# Patient Record
Sex: Male | Born: 1950
Health system: Southern US, Community
[De-identification: ages and names within clinical notes are randomized; demographics above are authoritative.]

## PROBLEM LIST (undated history)

## (undated) DIAGNOSIS — N179 Acute kidney failure, unspecified: Secondary | ICD-10-CM

## (undated) DIAGNOSIS — R55 Syncope and collapse: Secondary | ICD-10-CM

## (undated) DIAGNOSIS — I480 Paroxysmal atrial fibrillation: Secondary | ICD-10-CM

## (undated) DIAGNOSIS — N182 Chronic kidney disease, stage 2 (mild): Secondary | ICD-10-CM

## (undated) DIAGNOSIS — J45909 Unspecified asthma, uncomplicated: Secondary | ICD-10-CM

## (undated) DIAGNOSIS — F419 Anxiety disorder, unspecified: Secondary | ICD-10-CM

## (undated) DIAGNOSIS — G4733 Obstructive sleep apnea (adult) (pediatric): Secondary | ICD-10-CM

## (undated) DIAGNOSIS — E162 Hypoglycemia, unspecified: Secondary | ICD-10-CM

## (undated) DIAGNOSIS — Z9989 Dependence on other enabling machines and devices: Secondary | ICD-10-CM

## (undated) DIAGNOSIS — N183 Chronic kidney disease, stage 3 unspecified: Secondary | ICD-10-CM

## (undated) DIAGNOSIS — H9190 Unspecified hearing loss, unspecified ear: Secondary | ICD-10-CM

## (undated) DIAGNOSIS — Z599 Problem related to housing and economic circumstances, unspecified: Secondary | ICD-10-CM

## (undated) DIAGNOSIS — Z598 Other problems related to housing and economic circumstances: Secondary | ICD-10-CM

## (undated) DIAGNOSIS — I4892 Unspecified atrial flutter: Secondary | ICD-10-CM

## (undated) DIAGNOSIS — E785 Hyperlipidemia, unspecified: Secondary | ICD-10-CM

## (undated) DIAGNOSIS — F329 Major depressive disorder, single episode, unspecified: Secondary | ICD-10-CM

## (undated) DIAGNOSIS — I10 Essential (primary) hypertension: Secondary | ICD-10-CM

## (undated) DIAGNOSIS — E669 Obesity, unspecified: Secondary | ICD-10-CM

## (undated) DIAGNOSIS — E1169 Type 2 diabetes mellitus with other specified complication: Secondary | ICD-10-CM

## (undated) HISTORY — DX: Chronic kidney disease, stage 3 (moderate): N18.3

## (undated) HISTORY — DX: Syncope and collapse: R55

## (undated) HISTORY — DX: Chronic kidney disease, stage 3 unspecified: N18.30

## (undated) HISTORY — DX: Chronic kidney disease, stage 2 (mild): N18.2

## (undated) HISTORY — DX: Problem related to housing and economic circumstances, unspecified: Z59.9

## (undated) HISTORY — DX: Obesity, unspecified: E66.9

## (undated) HISTORY — DX: Paroxysmal atrial fibrillation: I48.0

## (undated) HISTORY — DX: Acute kidney failure, unspecified: N17.9

## (undated) HISTORY — PX: OTHER SURGICAL HISTORY: SHX169

## (undated) HISTORY — DX: Other problems related to housing and economic circumstances: Z59.8

## (undated) HISTORY — DX: Major depressive disorder, single episode, unspecified: F32.9

## (undated) HISTORY — DX: Unspecified asthma, uncomplicated: J45.909

## (undated) HISTORY — DX: Hypoglycemia, unspecified: E16.2

## (undated) HISTORY — DX: Unspecified atrial flutter: I48.92

---

## 2004-03-17 ENCOUNTER — Ambulatory Visit: Payer: Self-pay | Admitting: Internal Medicine

## 2004-06-05 ENCOUNTER — Ambulatory Visit: Payer: Self-pay | Admitting: Internal Medicine

## 2004-09-28 ENCOUNTER — Ambulatory Visit: Payer: Self-pay | Admitting: Internal Medicine

## 2005-01-03 ENCOUNTER — Ambulatory Visit: Payer: Self-pay | Admitting: Internal Medicine

## 2005-02-02 ENCOUNTER — Ambulatory Visit: Payer: Self-pay | Admitting: Internal Medicine

## 2005-03-08 ENCOUNTER — Ambulatory Visit: Payer: Self-pay | Admitting: Internal Medicine

## 2005-03-22 ENCOUNTER — Ambulatory Visit: Payer: Self-pay | Admitting: Internal Medicine

## 2006-04-24 ENCOUNTER — Ambulatory Visit: Payer: Self-pay | Admitting: Internal Medicine

## 2006-07-03 ENCOUNTER — Ambulatory Visit: Payer: Self-pay | Admitting: Internal Medicine

## 2006-08-01 ENCOUNTER — Ambulatory Visit: Payer: Self-pay | Admitting: Internal Medicine

## 2006-12-10 ENCOUNTER — Telehealth: Payer: Self-pay | Admitting: Internal Medicine

## 2006-12-10 DIAGNOSIS — F329 Major depressive disorder, single episode, unspecified: Secondary | ICD-10-CM

## 2006-12-10 DIAGNOSIS — F3289 Other specified depressive episodes: Secondary | ICD-10-CM

## 2006-12-10 HISTORY — DX: Major depressive disorder, single episode, unspecified: F32.9

## 2006-12-10 HISTORY — DX: Other specified depressive episodes: F32.89

## 2006-12-24 ENCOUNTER — Telehealth: Payer: Self-pay | Admitting: Internal Medicine

## 2006-12-25 ENCOUNTER — Telehealth (INDEPENDENT_AMBULATORY_CARE_PROVIDER_SITE_OTHER): Payer: Self-pay | Admitting: *Deleted

## 2007-01-30 ENCOUNTER — Telehealth (INDEPENDENT_AMBULATORY_CARE_PROVIDER_SITE_OTHER): Payer: Self-pay | Admitting: *Deleted

## 2007-11-12 ENCOUNTER — Telehealth (INDEPENDENT_AMBULATORY_CARE_PROVIDER_SITE_OTHER): Payer: Self-pay | Admitting: *Deleted

## 2007-11-17 ENCOUNTER — Encounter: Admission: RE | Admit: 2007-11-17 | Discharge: 2007-11-17 | Payer: Self-pay | Admitting: Family Medicine

## 2009-06-14 IMAGING — CR DG CHEST 2V
2 series · 2 of 2 positions shown · non-contrast
Comparison: None available.

CLINICAL DATA: Cough, shortness of breath and chest pain.

CHEST - 2 VIEW

[view not recorded (1 of 2)]
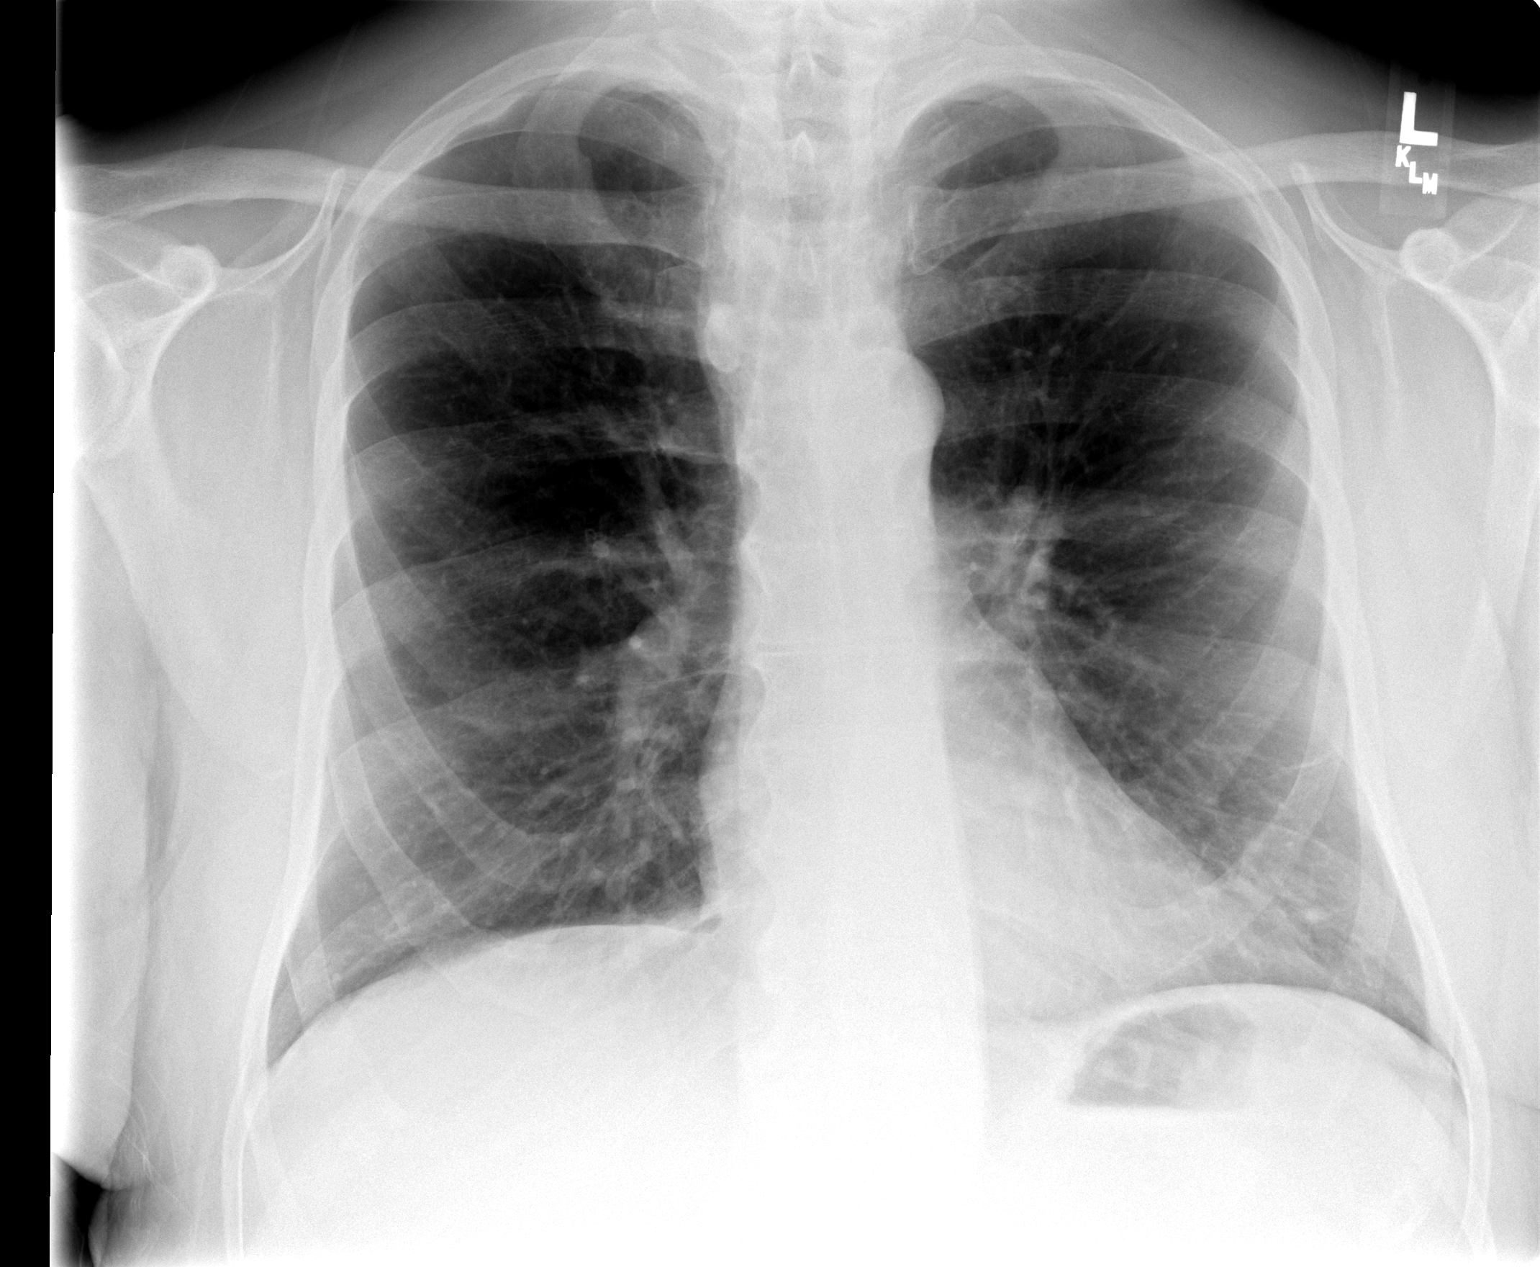

[view not recorded (2 of 2)]
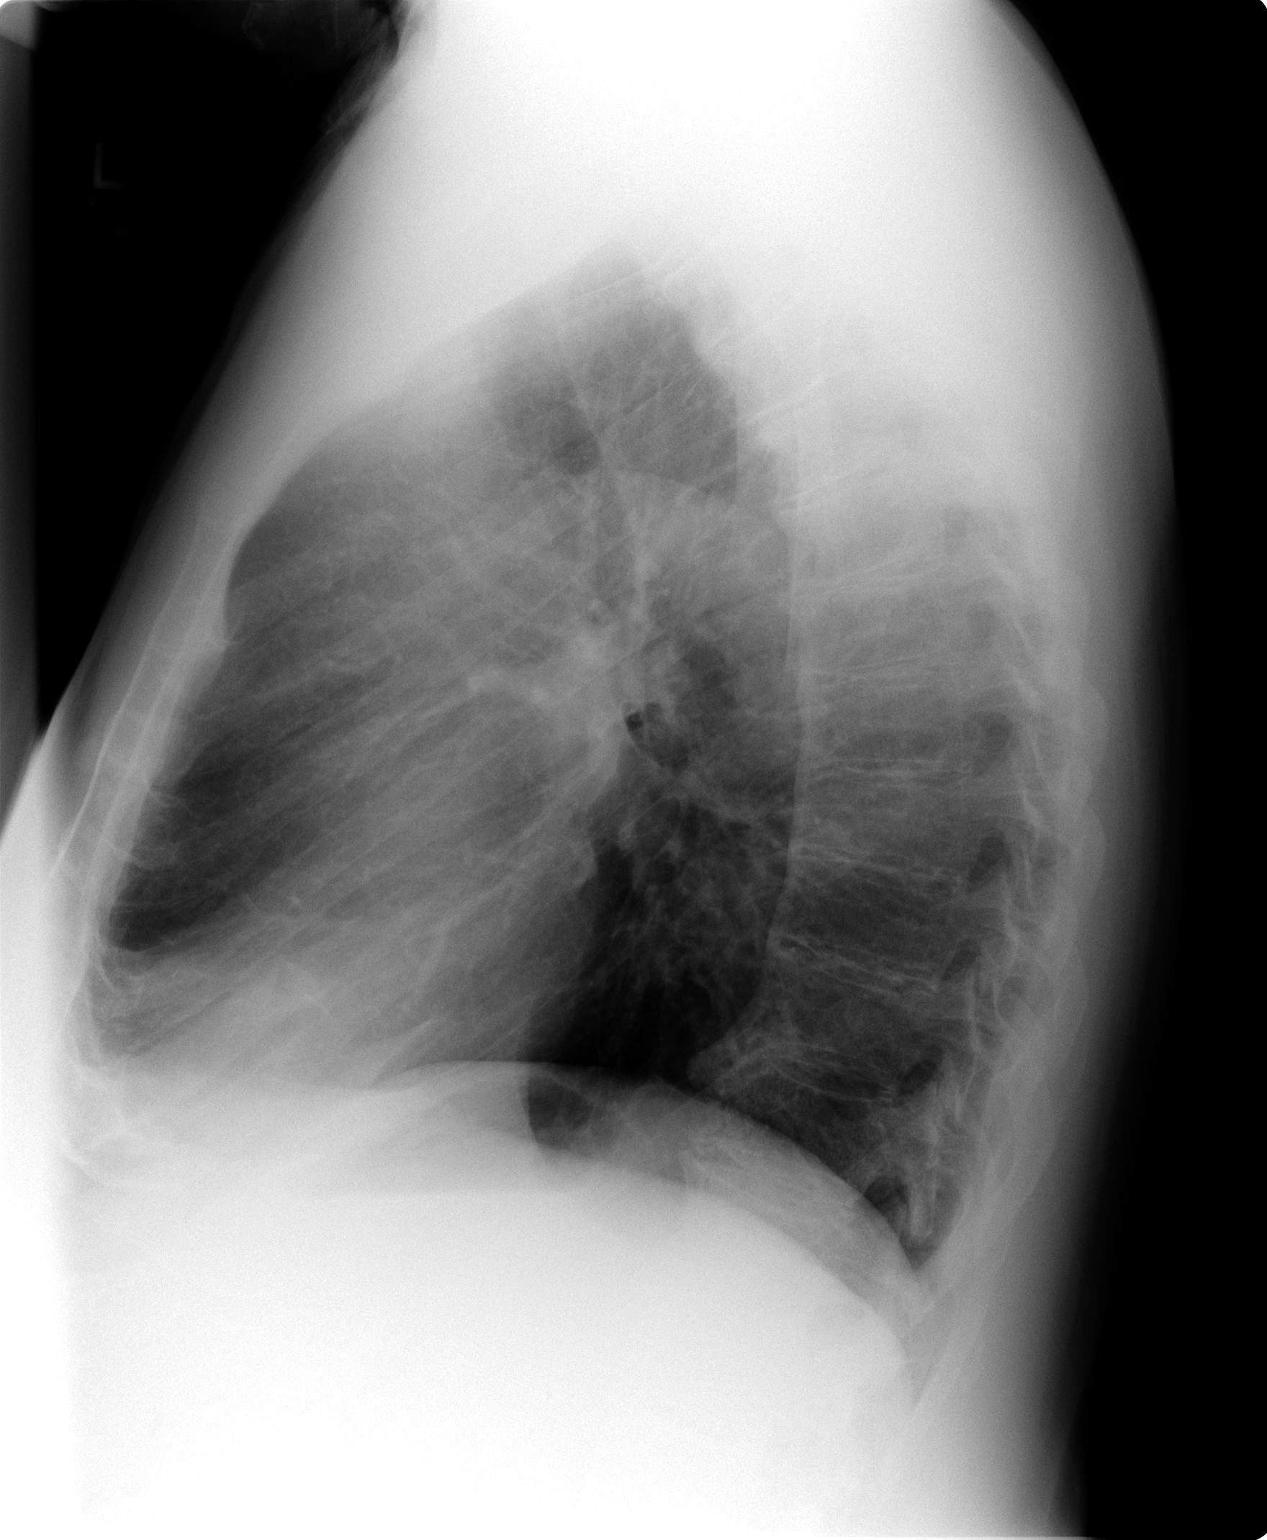

[2 of 2 positions shown; findings below may reference images not displayed]

FINDINGS: Trachea is midline.  Heart size normal.  There may be a
calcified lymph node in the right paratracheal station.  Linear
scar or subsegmental atelectasis in the lingula.  Lungs otherwise
clear.  No pleural fluid.  There is flowing anterior osteophytosis
in the thoracic spine.
IMPRESSION: No acute findings.

## 2011-10-09 ENCOUNTER — Encounter (HOSPITAL_COMMUNITY): Payer: Self-pay | Admitting: Emergency Medicine

## 2011-10-09 ENCOUNTER — Emergency Department (HOSPITAL_COMMUNITY)
Admission: EM | Admit: 2011-10-09 | Discharge: 2011-10-09 | Disposition: A | Discharge status: Home / Self Care | Payer: Self-pay | Attending: Emergency Medicine | Admitting: Emergency Medicine

## 2011-10-09 DIAGNOSIS — E119 Type 2 diabetes mellitus without complications: Secondary | ICD-10-CM | POA: Insufficient documentation

## 2011-10-09 DIAGNOSIS — S61219A Laceration without foreign body of unspecified finger without damage to nail, initial encounter: Secondary | ICD-10-CM

## 2011-10-09 DIAGNOSIS — W260XXA Contact with knife, initial encounter: Secondary | ICD-10-CM | POA: Insufficient documentation

## 2011-10-09 DIAGNOSIS — S61209A Unspecified open wound of unspecified finger without damage to nail, initial encounter: Secondary | ICD-10-CM | POA: Insufficient documentation

## 2011-10-09 DIAGNOSIS — Z79899 Other long term (current) drug therapy: Secondary | ICD-10-CM | POA: Insufficient documentation

## 2011-10-09 DIAGNOSIS — Y93G1 Activity, food preparation and clean up: Secondary | ICD-10-CM | POA: Insufficient documentation

## 2011-10-09 DIAGNOSIS — S6990XA Unspecified injury of unspecified wrist, hand and finger(s), initial encounter: Secondary | ICD-10-CM

## 2011-10-09 DIAGNOSIS — W261XXA Contact with sword or dagger, initial encounter: Secondary | ICD-10-CM | POA: Insufficient documentation

## 2011-10-09 MED ORDER — CEPHALEXIN 500 MG PO CAPS: 500.0000 mg | ORAL_CAPSULE | Freq: Four times a day (QID) | ORAL | Status: AC

## 2011-10-09 MED ORDER — BACITRACIN ZINC 500 UNIT/GM EX OINT
TOPICAL_OINTMENT | CUTANEOUS | Status: AC
  Administered 2011-10-09: 1
  Filled 2011-10-09: qty 0.9

## 2011-10-09 NOTE — ED Notes (Signed)
Rt thumb laceration from a kitchen knife approx 45 mins ago, has had tetnus in the last 5 yrs, bleding controlled with bandage. Able to move and has good circulation, wrapped with 4x4 and kling and pt refused to get a cbg due to cost.

## 2011-10-09 NOTE — Discharge Instructions (Signed)
Fingernail Removal Fingernails may need to be removed because of injury, infections, or correction of abnormal growth. A special non-stick bandage has been put on your finger tightly to prevent bleeding. Fingernails will usually grow back if the finger has not been badly injured and you carefully follow instructions. HOME CARE INSTRUCTIONS   Keep your hand elevated above your heart to relieve pain and swelling.   Keep your dressing dry and clean.   Change your bandage in 24 hours.   After your bandage is changed, soak your hand in warm soapy water for 10 to 20 minutes. Do this 3 times per day. This helps reduce pain and swelling. After soaking your hand, apply a clean, dry bandage. Change your bandage if it is wet or dirty.   Only take over-the-counter or prescription medicines for pain, discomfort, or fever as directed by your caregiver.   See your caregiver as needed for problems.   You may have received an instruction to follow up with your caregiver or a specialist. The failure to follow up as instructed could result in the permanent loss of a fingernail.  SEEK IMMEDIATE MEDICAL CARE IF:   You have increased pain, swelling, drainage, or bleeding.   You have a fever.  MAKE SURE YOU:   Understand these instructions.   Will watch your condition.   Will get help right away if you are not doing well or get worse.  Document Released: 04/27/2000 Document Revised: 04/19/2011 Document Reviewed: 09/02/2007 [facility] Patient Information 2012 [place], [place].Fingernail Removal Fingernails may need to be removed because of injury, infections, or correction of abnormal growth. A special non-stick bandage has been put on your finger tightly to prevent bleeding. Fingernails will usually grow back if the finger has not been badly injured and you carefully follow instructions. HOME CARE INSTRUCTIONS   Keep your hand elevated above your heart to relieve pain and swelling.   Keep your dressing dry  and clean.   Change your bandage in 24 hours.   After your bandage is changed, soak your hand in warm soapy water for 10 to 20 minutes. Do this 3 times per day. This helps reduce pain and swelling. After soaking your hand, apply a clean, dry bandage. Change your bandage if it is wet or dirty.   Only take over-the-counter or prescription medicines for pain, discomfort, or fever as directed by your caregiver.   See your caregiver as needed for problems.   You may have received an instruction to follow up with your caregiver or a specialist. The failure to follow up as instructed could result in the permanent loss of a fingernail.  SEEK IMMEDIATE MEDICAL CARE IF:   You have increased pain, swelling, drainage, or bleeding.   You have a fever.  MAKE SURE YOU:   Understand these instructions.   Will watch your condition.   Will get help right away if you are not doing well or get worse.  Document Released: 04/27/2000 Document Revised: 04/19/2011 Document Reviewed: 09/02/2007 [facility] Patient Information 2012 [place], [place].

## 2011-10-09 NOTE — ED Provider Notes (Signed)
History     CSN: [id number]  Arrival date & time 10/09/11  1313   First MD Initiated Contact with Patient 10/09/11 1358      Chief Complaint  Patient presents with  . Extremity Laceration    (Consider location/radiation/quality/duration/timing/severity/associated sxs/prior treatment) HPI Comments: Patient reports that approximately 40 minutes prior to arrival he cut his right thumb with a knife while slicing food.  Laceration located through the nail and both distal and proximal to the nail.  Bleeding controlled at this time.  He reports last tetanus was in May 2012.  He reports that he can move his thumb without difficulty.  Denies numbness.    Patient is a 61 y.o. male presenting with skin laceration. The history is provided by the patient.  Laceration  The laceration is 7 cm in size. The laceration mechanism was a a clean knife. The pain is moderate. He reports no foreign bodies present. His tetanus status is UTD.    Past Medical History  Diagnosis Date  . Diabetes mellitus     No past surgical history on file.  No family history on file.  History  Substance Use Topics  . Smoking status: Not on file  . Smokeless tobacco: Not on file  . Alcohol Use:       Review of Systems  Constitutional: Negative for fever and chills.  Gastrointestinal: Negative for nausea and vomiting.  Musculoskeletal: Negative for joint swelling.  Skin: Positive for wound.    Allergies  Sulfonamide derivatives  Home Medications   Current Outpatient Rx  Name Route Sig Dispense Refill  . ESCITALOPRAM OXALATE 10 MG PO TABS Oral Take 10 mg by mouth daily.    . OMEGA-3 FATTY ACIDS 1000 MG PO CAPS Oral Take 2 g by mouth daily.    Marland Kitchen GLUCOSAMINE-CHONDROITIN 500-400 MG PO TABS Oral Take 1 tablet by mouth 2 (two) times daily.    . INSULIN ISOPHANE HUMAN 100 UNIT/ML [place] SUSP Subcutaneous Inject 40 Units into the skin 2 (two) times daily.    Marland Kitchen LISINOPRIL 5 MG PO TABS Oral Take 5 mg by mouth daily.     Marland Kitchen METFORMIN HCL 500 MG PO TABS Oral Take 500 mg by mouth 3 (three) times daily.    . ADULT MULTIVITAMIN W/MINERALS CH Oral Take 2 tablets by mouth daily.    Marland Kitchen SIMVASTATIN 40 MG PO TABS Oral Take 20 mg by mouth every evening.      BP 130/66  Pulse 80  Temp 98 F (36.7 C)  Resp 18  SpO2 99%  Physical Exam  Nursing note and vitals reviewed. Constitutional: He appears well-developed and well-nourished.  HENT:  Head: Normocephalic and atraumatic.  Cardiovascular: Normal rate, regular rhythm and normal heart sounds.   Pulses:      Radial pulses are 2+ on the right side, and 2+ on the left side.  Pulmonary/Chest: Effort normal and breath sounds normal. No respiratory distress. He has no wheezes. He has no rales.  Musculoskeletal: Normal range of motion.       Full ROM of the DIP and MCP of the right thumb.  Neurological: He is alert. No sensory deficit. Gait normal.       Muscle strength of right thumb 5/5  Skin: Laceration noted.       7 cm laceration of right thumb extending through the nail.  Laceration both proximal and distal to the nail. Laceration that is proximal to the nail bed is superficial and non gaping.  Psychiatric: He has a normal mood and affect.    ED Course  NAIL REMOVAL Performed by: Anne Shutter, Lorence Nagengast Authorized by: Anne Shutter, Herbert Seta Consent: Verbal consent obtained. Risks and benefits: risks, benefits and alternatives were discussed Patient understanding: patient states understanding of the procedure being performed Patient identity confirmed: verbally with patient Location: right hand Location details: right thumb Anesthesia: digital block Local anesthetic: lidocaine 2% without epinephrine Preparation: sterile field established and skin prepped with Betadine Amount removed: partial (Distal 2/3) Wedge excision of skin of nail fold: yes Removed nail replaced and anchored: no Dressing: splint, Xeroform gauze and antibiotic ointment Patient  tolerance: Patient tolerated the procedure well with no immediate complications.   (including critical care time)  Labs Reviewed - No data to display No results found.   No diagnosis found.  LACERATION REPAIR Performed by: Anne Shutter, Dallas Scorsone Authorized by: Anne Shutter, Herbert Seta Consent: Verbal consent obtained. Risks and benefits: risks, benefits and alternatives were discussed Consent given by: patient Patient identity confirmed: provided demographic data Prepped and Draped in normal sterile fashion Wound explored  Laceration Location: right thumb  Laceration Length: 7 cm  No Foreign Bodies seen or palpated  Anesthesia: digital block  Local anesthetic: lidocaine 2% without epinephrine  Anesthetic total: 8 ml  Irrigation method: syringe Amount of cleaning: standard  Skin closure: 3-0 Prolene  Number of sutures: 4  Technique: Simple interrupted  Patient tolerance: Patient tolerated the procedure well with no immediate complications.  MDM  Patient with laceration of right thumb extending through the nail.  Distal 2/3 of the nail removed.  Laceration repaired without complications.  Patient neurovascularly intact.  Tetanus UTD.  Finger splinted to protect the laceration.          Pascal Lux [place], PA-C 10/10/11 2241

## 2011-10-11 NOTE — ED Provider Notes (Signed)
Medical screening examination/treatment/procedure(s) were performed by non-physician practitioner and as supervising physician I was immediately available for consultation/collaboration.  Cheri Guppy, MD 10/11/11 [phone]

## 2014-02-12 ENCOUNTER — Encounter (HOSPITAL_COMMUNITY): Payer: Self-pay | Admitting: Emergency Medicine

## 2014-02-12 ENCOUNTER — Emergency Department (HOSPITAL_COMMUNITY)
Admission: EM | Admit: 2014-02-12 | Discharge: 2014-02-12 | Disposition: A | Discharge status: Home / Self Care | Payer: Self-pay | Attending: Emergency Medicine | Admitting: Emergency Medicine

## 2014-02-12 DIAGNOSIS — J45909 Unspecified asthma, uncomplicated: Secondary | ICD-10-CM

## 2014-02-12 DIAGNOSIS — E1169 Type 2 diabetes mellitus with other specified complication: Secondary | ICD-10-CM

## 2014-02-12 DIAGNOSIS — E119 Type 2 diabetes mellitus without complications: Secondary | ICD-10-CM

## 2014-02-12 DIAGNOSIS — I4891 Unspecified atrial fibrillation: Secondary | ICD-10-CM

## 2014-02-12 DIAGNOSIS — Z9989 Dependence on other enabling machines and devices: Secondary | ICD-10-CM

## 2014-02-12 DIAGNOSIS — Z79899 Other long term (current) drug therapy: Secondary | ICD-10-CM | POA: Insufficient documentation

## 2014-02-12 DIAGNOSIS — Z794 Long term (current) use of insulin: Secondary | ICD-10-CM | POA: Insufficient documentation

## 2014-02-12 DIAGNOSIS — F419 Anxiety disorder, unspecified: Secondary | ICD-10-CM | POA: Insufficient documentation

## 2014-02-12 DIAGNOSIS — E162 Hypoglycemia, unspecified: Secondary | ICD-10-CM

## 2014-02-12 DIAGNOSIS — E11649 Type 2 diabetes mellitus with hypoglycemia without coma: Secondary | ICD-10-CM | POA: Insufficient documentation

## 2014-02-12 DIAGNOSIS — R55 Syncope and collapse: Secondary | ICD-10-CM

## 2014-02-12 DIAGNOSIS — E785 Hyperlipidemia, unspecified: Secondary | ICD-10-CM | POA: Insufficient documentation

## 2014-02-12 DIAGNOSIS — G4733 Obstructive sleep apnea (adult) (pediatric): Secondary | ICD-10-CM | POA: Diagnosis present

## 2014-02-12 DIAGNOSIS — I10 Essential (primary) hypertension: Secondary | ICD-10-CM | POA: Insufficient documentation

## 2014-02-12 DIAGNOSIS — E669 Obesity, unspecified: Secondary | ICD-10-CM

## 2014-02-12 DIAGNOSIS — H9190 Unspecified hearing loss, unspecified ear: Secondary | ICD-10-CM | POA: Insufficient documentation

## 2014-02-12 DIAGNOSIS — I480 Paroxysmal atrial fibrillation: Secondary | ICD-10-CM | POA: Diagnosis present

## 2014-02-12 DIAGNOSIS — R61 Generalized hyperhidrosis: Secondary | ICD-10-CM | POA: Insufficient documentation

## 2014-02-12 HISTORY — DX: Hyperlipidemia, unspecified: E78.5

## 2014-02-12 HISTORY — DX: Essential (primary) hypertension: I10

## 2014-02-12 HISTORY — DX: Type 2 diabetes mellitus with other specified complication: E66.9

## 2014-02-12 HISTORY — DX: Unspecified asthma, uncomplicated: J45.909

## 2014-02-12 HISTORY — DX: Obstructive sleep apnea (adult) (pediatric): G47.33

## 2014-02-12 HISTORY — DX: Dependence on other enabling machines and devices: Z99.89

## 2014-02-12 HISTORY — DX: Hypoglycemia, unspecified: E16.2

## 2014-02-12 HISTORY — DX: Type 2 diabetes mellitus with other specified complication: E11.69

## 2014-02-12 HISTORY — DX: Unspecified hearing loss, unspecified ear: H91.90

## 2014-02-12 HISTORY — DX: Anxiety disorder, unspecified: F41.9

## 2014-02-12 LAB — CBC WITH DIFFERENTIAL/PLATELET
Basophils Absolute: 0 10*3/uL (ref 0.0–0.1)
Basophils Relative: 0 % (ref 0–1)
Eosinophils Absolute: 0.3 10*3/uL (ref 0.0–0.7)
Eosinophils Relative: 3 % (ref 0–5)
HCT: 44.6 % (ref 39.0–52.0)
HEMOGLOBIN: 14.8 g/dL (ref 13.0–17.0)
LYMPHS ABS: 1.4 10*3/uL (ref 0.7–4.0)
Lymphocytes Relative: 13 % (ref 12–46)
MCH: 31.8 pg (ref 26.0–34.0)
MCHC: 33.2 g/dL (ref 30.0–36.0)
MCV: 95.9 fL (ref 78.0–100.0)
MONOS PCT: 8 % (ref 3–12)
Monocytes Absolute: 0.9 10*3/uL (ref 0.1–1.0)
NEUTROS ABS: 7.8 10*3/uL — AB (ref 1.7–7.7)
NEUTROS PCT: 76 % (ref 43–77)
Platelets: 162 10*3/uL (ref 150–400)
RBC: 4.65 MIL/uL (ref 4.22–5.81)
RDW: 13.5 % (ref 11.5–15.5)
WBC: 10.4 10*3/uL (ref 4.0–10.5)

## 2014-02-12 LAB — URINALYSIS, ROUTINE W REFLEX MICROSCOPIC
Bilirubin Urine: NEGATIVE
GLUCOSE, UA: NEGATIVE mg/dL
HGB URINE DIPSTICK: NEGATIVE
Ketones, ur: NEGATIVE mg/dL
Leukocytes, UA: NEGATIVE
Nitrite: NEGATIVE
Protein, ur: NEGATIVE mg/dL
SPECIFIC GRAVITY, URINE: 1.021 (ref 1.005–1.030)
Urobilinogen, UA: 0.2 mg/dL (ref 0.0–1.0)
pH: 5 (ref 5.0–8.0)

## 2014-02-12 LAB — BASIC METABOLIC PANEL
Anion gap: 13 (ref 5–15)
BUN: 26 mg/dL — AB (ref 6–23)
CHLORIDE: 104 meq/L (ref 96–112)
CO2: 25 meq/L (ref 19–32)
Calcium: 9.3 mg/dL (ref 8.4–10.5)
Creatinine, Ser: 1.29 mg/dL (ref 0.50–1.35)
GFR calc Af Amer: 67 mL/min — ABNORMAL LOW (ref >90)
GFR calc non Af Amer: 57 mL/min — ABNORMAL LOW (ref >90)
GLUCOSE: 68 mg/dL — AB (ref 70–99)
POTASSIUM: 4.2 meq/L (ref 3.7–5.3)
Sodium: 142 mEq/L (ref 137–147)

## 2014-02-12 LAB — TROPONIN I

## 2014-02-12 LAB — CBG MONITORING, ED
GLUCOSE-CAPILLARY: 147 mg/dL — AB (ref 70–99)
Glucose-Capillary: 114 mg/dL — ABNORMAL HIGH (ref 70–99)

## 2014-02-12 MED ORDER — RIVAROXABAN 20 MG PO TABS: 20.0000 mg | ORAL_TABLET | Freq: Every day | ORAL | Status: DC

## 2014-02-12 MED ORDER — DILTIAZEM HCL ER COATED BEADS 120 MG PO CP24: 120.0000 mg | ORAL_CAPSULE | Freq: Every day | ORAL | Status: AC
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
## 2014-02-12 NOTE — ED Notes (Signed)
Pt now in NSR HR 80.
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
## 2014-02-12 NOTE — ED Notes (Signed)
Pt HR increased to 160s rhythm changed to a-flutter for aprroximately 30-45 seconds, then returned to HR 90s a-fib.  Notified Dr Loretha StaplerWofford.

## 2014-02-12 NOTE — Discharge Instructions (Signed)
Anticoagulation, Generic Anticoagulants are medicines used to prevent clots from developing in your veins. These medicine are also known as blood thinners. If blood clots are untreated, they could travel to your lungs. This is called a pulmonary embolus. A blood clot in your lungs can be fatal.  Health care providers often use anticoagulants to prevent clots following surgery. Anticoagulants are also used along with aspirin when the heart is not getting enough blood. Another anticoagulant called warfarin is started 2 to 3 days after a rapid-acting injectable anticoagulant is started. The rapid-acting anticoagulants are usually continued until warfarin has begun to work. Your health care provider will judge this length of time by blood tests known as the prothrombin time (PT) and International Normalization Ratio (INR). This means that your blood is at the necessary and best level to prevent clots. RISKS AND COMPLICATIONS  If you have received recent epidural anesthesia, spinal anesthesia, or a spinal tap while receiving anticoagulants, you are at risk for developing a blood clot in or around the spine. This condition could result in long-term or permanent paralysis.  Because anticoagulants thin your blood, severe bleeding may occur from any tissue or organ. Symptoms of the blood being too thin may include:  Bleeding from the nose or gums that does not stop quickly.  Blood in bowel movements which may appear as bright red, dark, or black tarry stools.  Blood in the urine which may appear as pink, red, or brown urine.  Unusual bruising or bruising easily.  A cut that does not stop bleeding within 10 minutes.  Vomiting blood or continuous nausea for more than 1 day.  Coughing up blood.  Broken blood vessels in your eye (subconjunctival hemorrhage).  Abdominal or back pain with or without flank bruising.  Sudden, severe headache.  Sudden weakness or numbness of the face, arm, or leg,  especially on one side of the body.  Sudden confusion.  Trouble speaking (aphasia) or understanding.  Sudden trouble seeing in one or both eyes.  Sudden trouble walking.  Dizziness.  Loss of balance or coordination.  Vaginal bleeding.  Swelling or pain at an injection site.  Superficial fat tissue death (necrosis) which may cause skin scarring. This is more common in women and may first present as pain in the waist, thighs, or buttocks.  Fever.  Too little anticoagulation continues to allow the risk for blood clots. HOME CARE INSTRUCTIONS   Due to the complications of anticoagulants, it is very important that you take your anticoagulant as directed by your health care provider. Anticoagulants need to be taken exactly as instructed. Be sure you understand all your anticoagulant instructions.  Keep all follow-up appointments with your health care provider as directed. It is very important to keep your appointments. Not keeping appointments could result in a chronic or permanent injury, pain, or disability.  Warfarin. Your health care provider will advise you on the length of treatment (usually 3-6 months, sometimes lifelong).  Take warfarin exactly as directed by your health care provider. It is recommended that you take your warfarin dose at the same time of the day. It is preferred that you take warfarin in the late afternoon. If you have been told to stop taking warfarin, do not resume taking warfarin until directed to do so by your health care provider. Follow your health care provider's instructions if you accidentally take an extra dose or miss a dose of warfarin. It is very important to take warfarin as directed since bleeding or blood  clots could result in chronic or permanent injury, pain, or disability.  Too much and too little warfarin are both dangerous. Too much warfarin increases the risk of bleeding. Too little warfarin continues to allow the risk for blood clots. While  taking warfarin, you will need to have regular blood tests to measure your blood clotting time. These blood tests usually include both the prothrombin time (PT) and International Normalized Ratio (INR) tests. The PT and INR results allow your health care provider to adjust your dose of warfarin. The dose can change for many reasons. It is critically important that you have your PT and INR levels drawn exactly as directed. Your warfarin dose may stay the same or change depending on what the PT and INR results are. Be sure to follow up with your health care provider regarding your PT and INR test results and what your warfarin dosage should be.  Many medicines can interfere with warfarin and affect the PT and INR results. You must tell your health care provider about any and all medicines you take, this includes all vitamins and supplements. Ask your health care provider before taking these. Prescription and over-the-counter medicine consistency is critical to warfarin management. It is important that potential interactions are checked before you start a new medicine. Be especially cautious with aspirin and anti-inflammatory medicines. Ask your health care provider before taking these. Medicines such as antibiotics and acid-reducing medicine can interact with warfarin and can cause an increased warfarin effect. Warfarin can also interfere with the effectiveness of medicines you are taking. Do not take or discontinue any prescribed or over-the-counter medicine except on the advice of your health care provider or pharmacist.  Some vitamins, supplements, and herbal products interfere with the effectiveness of warfarin. Vitamin E may increase the anticoagulant effects of warfarin. Vitamin K may can cause warfarin to be less effective. Do not take or discontinue any vitamin, supplement, or herbal product except on the advice of your health care provider or pharmacist.  Eat what you normally eat and keep the vitamin K  content of your diet consistent. Avoid major changes in your diet, or notify your health care provider before changing your diet. Suddenly getting a lot more vitamin K could cause your blood to clot too quickly. A sudden decrease in vitamin K intake could cause your blood to clot too slowly. These changes in vitamin K intake could lead to dangerous blood clotsor to bleeding. To keep your vitamin K intake consistent, you must be aware of which foods contain moderate or high amounts of vitamin K. Some foods high in vitamin K include spinach, kale, broccoli, cabbage, greens, Brussels sprouts, asparagus, [place], coleslaw, parsley, and green tea. Arrange a visit with a dietitian to answer your questions.  If you have a loss of appetite or get the stomach flu (viral gastroenteritis), talk to your health care provider as soon as possible. A decrease in your normal vitamin K intake can make you more sensitive to your usual dose of warfarin.  Some medical conditions may increase your risk for bleeding while you are taking warfarin. A fever, diarrhea lasting more than a day, worsening heart failure, or worsening liver function are some medical conditions that could affect warfarin. Contact your health care provider if you have any of these medical conditions.  Alcohol can change the body's ability to handle warfarin. It is best to avoid alcoholic drinks or consume only very small amounts while taking warfarin. Notify your health care provider if  you change your alcohol intake. A sudden increase in alcohol use can increase your risk of bleeding. Chronic alcohol use can cause warfarin to be less effective.  Be careful not to cut yourself when using sharp objects or while shaving.  Inform all your health care providers and your dentist that you take an anticoagulant.  Limit physical activities or sports that could result in a fall or cause injury. Avoid contact sports.  Wear medical alert jewelry or carry a  medical alert card. SEEK IMMEDIATE MEDICAL CARE IF:  You cough up blood.  You have dark or black stools or there is bright red blood coming from your rectum.  You vomit blood or have nausea for more than 1 day.  You have blood in the urine or pink colored urine.  You have unusual bruising or have increased bruising.  You have bleeding from the nose or gums that does not stop quickly.  You have a cut that does not stop bleeding within a 2-3 minutes.  You have sudden weakness or numbness of the face, arm, or leg, especially on one side of the body.  You have sudden confusion.  You have trouble speaking (aphasia) or understanding.  You have sudden trouble seeing in one or both eyes.  You have sudden trouble walking.  You have dizziness.  You have a loss of balance or coordination.  You have a sudden, severe headache.  You have a serious fall or head injury, even if you are not bleeding.  You have swelling or pain at an injection site.  You have unexplained tenderness or pain in the abdomen, back, waist, thighs or buttocks.  You have a fever. Any of these symptoms may represent a serious problem that is an emergency. Do not wait to see if the symptoms will go away. Get medical help right away. Call your local emergency services (911 in [place] Do not drive yourself to the hospital. Document Released: 04/30/2005 Document Revised: 05/05/2013 Document Reviewed: 12/03/2007 [facility] Patient Information 2015 [place], [place]. This information is not intended to replace advice given to you by your health care provider. Make sure you discuss any questions you have with your health care provider.  Atrial Fibrillation Atrial fibrillation is a type of irregular heart rhythm (arrhythmia). During atrial fibrillation, the upper chambers of the heart (atria) quiver continuously in a chaotic pattern. This causes an irregular and often rapid heart rate.  Atrial fibrillation is the result of  the heart becoming overloaded with disorganized signals that tell it to beat. These signals are normally released one at a time by a part of the right atrium called the sinoatrial node. They then travel from the atria to the lower chambers of the heart (ventricles), causing the atria and ventricles to contract and pump blood as they pass. In atrial fibrillation, parts of the atria outside of the sinoatrial node also release these signals. This results in two problems. First, the atria receive so many signals that they do not have time to fully contract. Second, the ventricles, which can only receive one signal at a time, beat irregularly and out of rhythm with the atria.  There are three types of atrial fibrillation:   Paroxysmal. Paroxysmal atrial fibrillation starts suddenly and stops on its own within a week.  Persistent. Persistent atrial fibrillation lasts for more than a week. It may stop on its own or with treatment.  Permanent. Permanent atrial fibrillation does not go away. Episodes of atrial fibrillation may lead to permanent  atrial fibrillation. Atrial fibrillation can prevent your heart from pumping blood normally. It increases your risk of stroke and can lead to heart failure.  CAUSES   Heart conditions, including a heart attack, heart failure, coronary artery disease, and heart valve conditions.   Inflammation of the sac that surrounds the heart (pericarditis).  Blockage of an artery in the lungs (pulmonary embolism).  Pneumonia or other infections.  Chronic lung disease.  Thyroid problems, especially if the thyroid is overactive (hyperthyroidism).  Caffeine, excessive alcohol use, and use of some illegal drugs.   Use of some medicines, including certain decongestants and diet pills.  Heart surgery.   Birth defects.  Sometimes, no cause can be found. When this happens, the atrial fibrillation is called lone atrial fibrillation. The risk of complications from atrial  fibrillation increases if you have lone atrial fibrillation and you are age 64 years or older. RISK FACTORS  Heart failure.  Coronary artery disease.  Diabetes mellitus.   High blood pressure (hypertension).   Obesity.   Other arrhythmias.   Increased age. SIGNS AND SYMPTOMS   A feeling that your heart is beating rapidly or irregularly.   A feeling of discomfort or pain in your chest.   Shortness of breath.   Sudden light-headedness or weakness.   Getting tired easily when exercising.   Urinating more often than normal (mainly when atrial fibrillation first begins).  In paroxysmal atrial fibrillation, symptoms may start and suddenly stop. DIAGNOSIS  Your health care provider may be able to detect atrial fibrillation when taking your pulse. Your health care provider may have you take a test called an ambulatory electrocardiogram (ECG). An ECG records your heartbeat patterns over a 24-hour period. You may also have other tests, such as:  Transthoracic echocardiogram (TTE). During echocardiography, sound waves are used to evaluate how blood flows through your heart.  Transesophageal echocardiogram (TEE).  Stress test. There is more than one type of stress test. If a stress test is needed, ask your health care provider about which type is best for you.  Chest X-ray exam.  Blood tests.  Computed tomography (CT). TREATMENT  Treatment may include:  Treating any underlying conditions. For example, if you have an overactive thyroid, treating the condition may correct atrial fibrillation.  Taking medicine. Medicines may be given to control a rapid heart rate or to prevent blood clots, heart failure, or a stroke.  Having a procedure to correct the rhythm of the heart:  Electrical cardioversion. During electrical cardioversion, a controlled, low-energy shock is delivered to the heart through your skin. If you have chest pain, very low blood pressure, or sudden heart  failure, this procedure may need to be done as an emergency.  Catheter ablation. During this procedure, heart tissues that send the signals that cause atrial fibrillation are destroyed.  Surgical ablation. During this surgery, thin lines of heart tissue that carry the abnormal signals are destroyed. This procedure can either be an open-heart surgery or a minimally invasive surgery. With the minimally invasive surgery, small cuts are made to access the heart instead of a large opening.  Pulmonary venous isolation. During this surgery, tissue around the veins that carry blood from the lungs (pulmonary veins) is destroyed. This tissue is thought to carry the abnormal signals. HOME CARE INSTRUCTIONS   Take medicines only as directed by your health care provider. Some medicines can make atrial fibrillation worse or recur.  If blood thinners were prescribed by your health care provider, take them exactly  as directed. Too much blood-thinning medicine can cause bleeding. If you take too little, you will not have the needed protection against stroke and other problems.  Perform blood tests at home if directed by your health care provider. Perform blood tests exactly as directed.  Quit smoking if you smoke.  Do not drink alcohol.  Do not drink caffeinated beverages such as coffee, soda, and some teas. You may drink decaffeinated coffee, soda, or tea.   Maintain a healthy weight.Do not use diet pills unless your health care provider approves. They may make heart problems worse.   Follow diet instructions as directed by your health care provider.  Exercise regularly as directed by your health care provider.  Keep all follow-up visits as directed by your health care provider. This is important. PREVENTION  The following substances can cause atrial fibrillation to recur:   Caffeinated beverages.  Alcohol.  Certain medicines, especially those used for breathing problems.  Certain herbs and  herbal medicines, such as those containing ephedra or ginseng.  Illegal drugs, such as cocaine and amphetamines. Sometimes medicines are given to prevent atrial fibrillation from recurring. Proper treatment of any underlying condition is also important in helping prevent recurrence.  SEEK MEDICAL CARE IF:  You notice a change in the rate, rhythm, or strength of your heartbeat.  You suddenly begin urinating more frequently.  You tire more easily when exerting yourself or exercising. SEEK IMMEDIATE MEDICAL CARE IF:   You have chest pain, abdominal pain, sweating, or weakness.  You feel nauseous.  You have shortness of breath.  You suddenly have swollen feet and ankles.  You feel dizzy.  Your face or limbs feel numb or weak.  You have a change in your vision or speech. MAKE SURE YOU:   Understand these instructions.  Will watch your condition.  Will get help right away if you are not doing well or get worse. Document Released: 04/30/2005 Document Revised: 09/14/2013 Document Reviewed: 06/10/2012 [facility] Patient Information 2015 [place]. This information is not intended to replace advice given to you by your health care provider. Make sure you discuss any questions you have with your health care provider.  Hypoglycemia Hypoglycemia occurs when the glucose in your blood is too low. Glucose is a type of sugar that is your body's main energy source. Hormones, such as insulin and glucagon, control the level of glucose in the blood. Insulin lowers blood glucose and glucagon increases blood glucose. Having too much insulin in your blood stream, or not eating enough food containing sugar, can result in hypoglycemia. Hypoglycemia can happen to people with or without diabetes. It can develop quickly and can be a medical emergency.  CAUSES   Missing or delaying meals.  Not eating enough carbohydrates at meals.  Taking too much diabetes medicine.  Not timing your oral diabetes  medicine or insulin doses with meals, snacks, and exercise.  Nausea and vomiting.  Certain medicines.  Severe illnesses, such as hepatitis, kidney disorders, and certain eating disorders.  Increased activity or exercise without eating something extra or adjusting medicines.  Drinking too much alcohol.  A nerve disorder that affects body functions like your heart rate, blood pressure, and digestion (autonomic neuropathy).  A condition where the stomach muscles do not function properly (gastroparesis). Therefore, medicines and food may not absorb properly.  Rarely, a tumor of the pancreas can produce too much insulin. SYMPTOMS   Hunger.  Sweating (diaphoresis).  Change in body temperature.  Shakiness.  Headache.  Anxiety.  Lightheadedness.  Irritability.  Difficulty concentrating.  Dry mouth.  Tingling or numbness in the hands or feet.  Restless sleep or sleep disturbances.  Altered speech and coordination.  Change in mental status.  Seizures or prolonged convulsions.  Combativeness.  Drowsiness (lethargic).  Weakness.  Increased heart rate or palpitations.  Confusion.  Pale, gray skin color.  Blurred or double vision.  Fainting. DIAGNOSIS  A physical exam and medical history will be performed. Your caregiver may make a diagnosis based on your symptoms. Blood tests and other lab tests may be performed to confirm a diagnosis. Once the diagnosis is made, your caregiver will see if your signs and symptoms go away once your blood glucose is raised.  TREATMENT  Usually, you can easily treat your hypoglycemia when you notice symptoms.  Check your blood glucose. If it is less than 70 mg/dl, take one of the following:   3-4 glucose tablets.    cup juice.    cup regular soda.   1 cup skim milk.   -1 tube of glucose gel.   5-6 hard candies.   Avoid high-fat drinks or food that may delay a rise in blood glucose levels.  Do not take  more than the recommended amount of sugary foods, drinks, gel, or tablets. Doing so will cause your blood glucose to go too high.   Wait 10-15 minutes and recheck your blood glucose. If it is still less than 70 mg/dl or below your target range, repeat treatment.   Eat a snack if it is more than 1 hour until your next meal.  There may be a time when your blood glucose may go so low that you are unable to treat yourself at home when you start to notice symptoms. You may need someone to help you. You may even faint or be unable to swallow. If you cannot treat yourself, someone will need to bring you to the hospital.  HOME CARE INSTRUCTIONS  If you have diabetes, follow your diabetes management plan by:  Taking your medicines as directed.  Following your exercise plan.  Following your meal plan. Do not skip meals. Eat on time.  Testing your blood glucose regularly. Check your blood glucose before and after exercise. If you exercise longer or different than usual, be sure to check blood glucose more frequently.  Wearing your medical alert jewelry that says you have diabetes.  Identify the cause of your hypoglycemia. Then, develop ways to prevent the recurrence of hypoglycemia.  Do not take a hot bath or shower right after an insulin shot.  Always carry treatment with you. Glucose tablets are the easiest to carry.  If you are going to drink alcohol, drink it only with meals.  Tell friends or family members ways to keep you safe during a seizure. This may include removing hard or sharp objects from the area or turning you on your side.  Maintain a healthy weight. SEEK MEDICAL CARE IF:   You are having problems keeping your blood glucose in your target range.  You are having frequent episodes of hypoglycemia.  You feel you might be having side effects from your medicines.  You are not sure why your blood glucose is dropping so low.  You notice a change in vision or a new problem  with your vision. SEEK IMMEDIATE MEDICAL CARE IF:   Confusion develops.  A change in mental status occurs.  The inability to swallow develops.  Fainting occurs. Document Released: 04/30/2005 Document Revised: 05/05/2013 Document  Reviewed: 08/27/2011 [employer] Patient Information 2015 [place], [place]. This information is not intended to replace advice given to you by your health care provider. Make sure you discuss any questions you have with your health care provider.
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
## 2014-02-15 LAB — CBG MONITORING, ED: Glucose-Capillary: 189 mg/dL — ABNORMAL HIGH (ref 70–99)

## 2014-02-26 ENCOUNTER — Other Ambulatory Visit (INDEPENDENT_AMBULATORY_CARE_PROVIDER_SITE_OTHER): Payer: Self-pay

## 2014-02-26 ENCOUNTER — Encounter: Payer: Self-pay | Admitting: Physician Assistant

## 2014-02-26 ENCOUNTER — Ambulatory Visit (INDEPENDENT_AMBULATORY_CARE_PROVIDER_SITE_OTHER): Payer: Self-pay | Admitting: Physician Assistant

## 2014-02-26 VITALS — BP 118/70 | HR 85 | Ht 72.0 in | Wt 245.4 lb

## 2014-02-26 DIAGNOSIS — I10 Essential (primary) hypertension: Secondary | ICD-10-CM

## 2014-02-26 DIAGNOSIS — E669 Obesity, unspecified: Secondary | ICD-10-CM

## 2014-02-26 DIAGNOSIS — G4733 Obstructive sleep apnea (adult) (pediatric): Secondary | ICD-10-CM

## 2014-02-26 DIAGNOSIS — Z9989 Dependence on other enabling machines and devices: Secondary | ICD-10-CM

## 2014-02-26 DIAGNOSIS — N179 Acute kidney failure, unspecified: Secondary | ICD-10-CM

## 2014-02-26 DIAGNOSIS — R079 Chest pain, unspecified: Secondary | ICD-10-CM

## 2014-02-26 DIAGNOSIS — R55 Syncope and collapse: Secondary | ICD-10-CM

## 2014-02-26 DIAGNOSIS — I480 Paroxysmal atrial fibrillation: Secondary | ICD-10-CM

## 2014-02-26 HISTORY — DX: Obesity, unspecified: E66.9

## 2014-02-26 HISTORY — DX: Acute kidney failure, unspecified: N17.9

## 2014-02-26 LAB — BASIC METABOLIC PANEL
BUN: 27 mg/dL — AB (ref 6–23)
CO2: 23 mEq/L (ref 19–32)
Calcium: 8.9 mg/dL (ref 8.4–10.5)
Chloride: 103 mEq/L (ref 96–112)
Creatinine, Ser: 1.7 mg/dL — ABNORMAL HIGH (ref 0.4–1.5)
GFR: 44.59 mL/min — ABNORMAL LOW (ref >60.00)
GLUCOSE: 185 mg/dL — AB (ref 70–99)
POTASSIUM: 4.8 meq/L (ref 3.5–5.1)
Sodium: 136 mEq/L (ref 135–145)

## 2014-02-26 LAB — T4, FREE: Free T4: 1.12 ng/dL (ref 0.60–1.60)

## 2014-02-26 LAB — TSH: TSH: 0.13 u[IU]/mL — ABNORMAL LOW (ref 0.35–4.50)

## 2014-02-26 MED ORDER — RIVAROXABAN 20 MG PO TABS: 20.0000 mg | ORAL_TABLET | Freq: Every day | ORAL | Status: DC

## 2014-02-26 NOTE — Patient Instructions (Addendum)
Your physician recommends that you continue on your current medications as directed. Please refer to the Current Medication list given to you today.   Your physician recommends that you return for lab work in:   BMET AND TSH    Your physician has requested that you have an echocardiogram. Echocardiography is a painless test that uses sound waves to create images of your heart. It provides your doctor with information about the size and shape of your heart and how well your heart's chambers and valves are working. This procedure takes approximately one hour. There are no restrictions for this procedure.  Your physician has requested that you have an exercise tolerance test. For further information please visit [email or web address]. Please also follow instruction sheet, as given.   FOLLOW UP WITH DANA DUNN IN ONE MONTH   CALL PCP TO DISCUSS DECREASE CHANGE SIMVASTATIN SINCE MAX DOSE IS 10 MG ON DILTIAZEM
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
## 2014-03-01 ENCOUNTER — Telehealth: Payer: Self-pay | Admitting: *Deleted

## 2014-03-01 DIAGNOSIS — I480 Paroxysmal atrial fibrillation: Secondary | ICD-10-CM

## 2014-03-01 DIAGNOSIS — N179 Acute kidney failure, unspecified: Secondary | ICD-10-CM

## 2014-03-01 NOTE — Telephone Encounter (Signed)
lmptcb for lab results 

## 2014-03-01 NOTE — Telephone Encounter (Signed)
pt states he cannot come in this week for lab work due to he has to work [employer]. I asked if he could do Monday 10/26 and he said he thinks he could do that. I advised I will let Ronie Spiesayna Dunn, PA know and I think will be ok, pt said thank you.

## 2014-03-04 ENCOUNTER — Telehealth: Payer: Self-pay | Admitting: Physician Assistant

## 2014-03-04 NOTE — Telephone Encounter (Addendum)
Please let the patient know I talked to our team about medication assistance. Doylene BodeGina Lurz, our clinical manager, is working on setting him up with a [facility] social worker. (Patient is self-pay and very little income.) He should be hearing from them soon and if he does not, please let us know. Ledia Hanford PA-C

## 2014-03-05 ENCOUNTER — Telehealth: Payer: Self-pay | Admitting: *Deleted

## 2014-03-05 NOTE — Telephone Encounter (Signed)
s/w pt per Ronie Spiesayna Dunn, PA that Doylene BodeGina Lurz, RN Clinical Manager is working on trying to get [facility] Social Work[facility] for pt to help with cost of test. Pt said thank you for the help. I advised ptcb if he does not hear anything within in the next 2-3 weeks.

## 2014-03-05 NOTE — Telephone Encounter (Signed)
s/w pt per Dayna Dunn, PA that Gina Lurz, RN Clinical Manager is working on trying to get [facility] Social Worker for pt to help with cost of test. Pt said thank you for the help. I advised ptcb if he does not hear anything within in the next 2-3 weeks.  

## 2015-10-18 DIAGNOSIS — E11621 Type 2 diabetes mellitus with foot ulcer: Secondary | ICD-10-CM | POA: Diagnosis not present

## 2015-10-18 DIAGNOSIS — L0390 Cellulitis, unspecified: Secondary | ICD-10-CM | POA: Diagnosis not present

## 2015-10-18 DIAGNOSIS — T148 Other injury of unspecified body region: Secondary | ICD-10-CM | POA: Diagnosis not present

## 2015-10-18 DIAGNOSIS — F329 Major depressive disorder, single episode, unspecified: Secondary | ICD-10-CM | POA: Diagnosis not present

## 2015-10-18 DIAGNOSIS — Z794 Long term (current) use of insulin: Secondary | ICD-10-CM | POA: Diagnosis not present

## 2015-10-18 DIAGNOSIS — I4891 Unspecified atrial fibrillation: Secondary | ICD-10-CM | POA: Diagnosis not present

## 2015-10-18 DIAGNOSIS — Z7984 Long term (current) use of oral hypoglycemic drugs: Secondary | ICD-10-CM | POA: Diagnosis not present

## 2015-10-18 DIAGNOSIS — I10 Essential (primary) hypertension: Secondary | ICD-10-CM | POA: Diagnosis not present

## 2015-11-25 DIAGNOSIS — Z0000 Encounter for general adult medical examination without abnormal findings: Secondary | ICD-10-CM | POA: Diagnosis not present

## 2015-11-25 DIAGNOSIS — Z794 Long term (current) use of insulin: Secondary | ICD-10-CM | POA: Diagnosis not present

## 2015-11-25 DIAGNOSIS — I10 Essential (primary) hypertension: Secondary | ICD-10-CM | POA: Diagnosis not present

## 2015-11-25 DIAGNOSIS — Z1211 Encounter for screening for malignant neoplasm of colon: Secondary | ICD-10-CM | POA: Diagnosis not present

## 2015-11-25 DIAGNOSIS — E782 Mixed hyperlipidemia: Secondary | ICD-10-CM | POA: Diagnosis not present

## 2015-11-25 DIAGNOSIS — Z125 Encounter for screening for malignant neoplasm of prostate: Secondary | ICD-10-CM | POA: Diagnosis not present

## 2015-11-25 DIAGNOSIS — Z23 Encounter for immunization: Secondary | ICD-10-CM | POA: Diagnosis not present

## 2015-11-25 DIAGNOSIS — E119 Type 2 diabetes mellitus without complications: Secondary | ICD-10-CM | POA: Diagnosis not present

## 2015-11-25 DIAGNOSIS — I4891 Unspecified atrial fibrillation: Secondary | ICD-10-CM | POA: Diagnosis not present

## 2015-11-29 ENCOUNTER — Encounter (INDEPENDENT_AMBULATORY_CARE_PROVIDER_SITE_OTHER): Payer: Self-pay

## 2015-11-29 ENCOUNTER — Ambulatory Visit (INDEPENDENT_AMBULATORY_CARE_PROVIDER_SITE_OTHER): Payer: Medicare Other | Admitting: Interventional Cardiology

## 2015-11-29 ENCOUNTER — Encounter: Payer: Self-pay | Admitting: Interventional Cardiology

## 2015-11-29 VITALS — BP 130/60 | HR 72 | Ht 72.0 in | Wt 239.0 lb

## 2015-11-29 DIAGNOSIS — I483 Typical atrial flutter: Secondary | ICD-10-CM | POA: Diagnosis not present

## 2015-11-29 DIAGNOSIS — Z794 Long term (current) use of insulin: Secondary | ICD-10-CM

## 2015-11-29 DIAGNOSIS — N183 Chronic kidney disease, stage 3 (moderate): Secondary | ICD-10-CM

## 2015-11-29 DIAGNOSIS — E1122 Type 2 diabetes mellitus with diabetic chronic kidney disease: Secondary | ICD-10-CM

## 2015-11-29 DIAGNOSIS — I10 Essential (primary) hypertension: Secondary | ICD-10-CM | POA: Diagnosis not present

## 2015-11-29 DIAGNOSIS — G4733 Obstructive sleep apnea (adult) (pediatric): Secondary | ICD-10-CM

## 2015-11-29 DIAGNOSIS — I480 Paroxysmal atrial fibrillation: Secondary | ICD-10-CM

## 2015-11-29 NOTE — Patient Instructions (Signed)
**Note De-Identified Sophina Mitten Obfuscation** Medication Instructions:  Same-no changes  Labwork: None  Testing/Procedures: None  Follow-Up: Your physician recommends that you schedule a follow-up appointment in: 2 months   Any Other Special Instructions Will Be Listed Below (If Applicable). We will contact you after we receive your lab results from your primary care md to let you know which medication you will start.    If you need a refill on your cardiac medications before your next appointment, please call your pharmacy.

## 2015-11-29 NOTE — Progress Notes (Signed)
Cardiology Office Note   Date:  11/29/2015   ID:  Lawrence Knox, DOB [birth year], MRN [id number]  PCP:  Emeterio Reeve, MD    No chief complaint on file. AFib   Wt Readings from Last 3 Encounters:  11/29/15 239 lb (108.41 kg)  02/26/14 245 lb 6.4 oz (111.313 kg)  02/12/14 247 lb (112.038 kg)       History of Present Illness: Lawrence Knox is a 65 y.o. male  who has a history of paroxysmal atrial fibrillation, diagnosed in 2015. He had a syncopal spell at that time and his blood sugar was low.  Xarelto was prescribed at the time. However, due to cost, he was not able to get it initially.  He declined any type of extensive workup due to financial constraints. He did not want to take Coumadin.  Echocardiogram, TSH were offered. He had some acute kidney injury at the time.  Exercise treadmill test was also recommended at the time. It does not appear that that happened.  He reports never having a stress test.  He has seen Dr. Algie Coffer a few times since 2015.  He had been on simvastatin. Due to taking diltiazem, the simvastatin dose was restricted. It was offered to him to switch statins but he preferred to follow-up with primary care.  He has been more depressed.  He has not been taking his meds properly.  His DM is poorly controlled.    He reports sweating and fatigue.  His A1C has been increased.    He is "paranoid" about any heavy activity.  He has not felt palpitations while being in atrial flutter today.  He does use CPAP.  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  No chest discomfort with that.     Past Medical History  Diagnosis Date  . Diabetes mellitus type 2 in obese (HCC)   . Hypertension   . Hyperlipidemia   . Anxiety   . Asthma   . Hearing loss   . OSA on CPAP   . Syncope     a. 02/2014 felt secondary to hypoglycemia, compounded by AF RVR.  Marland Kitchen PAF (paroxysmal atrial fibrillation) (HCC)     a. Dx 02/2014 in setting of  syncope.  . Financial difficulties     Past Surgical History  Procedure Laterality Date  . None       Current Outpatient Prescriptions  Medication Sig Dispense Refill  . Ascorbic Acid (VITAMIN C) 1000 MG tablet Take 1,000 mg by mouth daily.    Marland Kitchen CINNAMON PO Take 1 tablet by mouth 2 (two) times daily.    Marland Kitchen diltiazem (CARDIZEM CD) 120 MG 24 hr capsule Take 1 capsule (120 mg total) by mouth daily. 30 capsule 11  . escitalopram (LEXAPRO) 10 MG tablet Take 15 mg by mouth daily.     . fish oil-omega-3 fatty acids 1000 MG capsule Take 2 g by mouth daily.    Marland Kitchen glucosamine-chondroitin 500-400 MG tablet Take 1 tablet by mouth 2 (two) times daily.    . insulin NPH (HUMULIN N,NOVOLIN N) 100 UNIT/ML injection Inject 25-65 Units into the skin 2 (two) times daily. 65 am and 25 pm    . lisinopril (PRINIVIL,ZESTRIL) 5 MG tablet Take 5 mg by mouth daily.    . Multiple Vitamin (MULITIVITAMIN WITH MINERALS) TABS Take 2 tablets by mouth daily.    . Nutritional Supplements (DHEA PO) Take 1 tablet by mouth daily.    Marland Kitchen  vitamin E 400 UNIT capsule Take 400 Units by mouth daily.     No current facility-administered medications for this visit.    Allergies:   Sulfonamide derivatives    Social History:  The patient  reports that he has never smoked. He has never used smokeless tobacco. He reports that he does not drink alcohol or use illicit drugs.   Family History:  The patient's family history includes CVA (age of onset: 2871) in his father. There is no history of Heart attack.    ROS:  Please see the history of present illness.   Otherwise, review of systems are positive for transient left leg swelling a few months ago.   All other systems are reviewed and negative.    PHYSICAL EXAM: VS:  BP 130/60 mmHg  Pulse 72  Ht 6' (1.829 m)  Wt 239 lb (108.41 kg)  BMI 32.41 kg/m2 , BMI Body mass index is 32.41 kg/(m^2). GEN: Well nourished, well developed, in no acute distress HEENT: normal Neck: no JVD,  carotid bruits, or masses Cardiac: irregularly irregular; no murmurs, rubs, or gallops,tr edema  Respiratory:  clear to auscultation bilaterally, normal work of breathing GI: soft, nontender, nondistended, + BS MS: no deformity or atrophy Skin: warm and dry, no rash Neuro:  Strength and sensation are intact Psych: euthymic mood, full affect   EKG:   The ekg ordered today demonstrates atrial flutter, rate controlled   Recent Labs: No results found for requested labs within last 365 days.   Lipid Panel No results found for: CHOL, TRIG, HDL, CHOLHDL, VLDL, LDLCALC, LDLDIRECT   Other studies Reviewed: Additional studies/ records that were reviewed today with results demonstrating: 10/15: NSR.   ASSESSMENT AND PLAN:  Paroxysmal atrial fibrillation: This patients CHA2DS2-VASc Score and unadjusted Ischemic Stroke Rate (% per year) is equal to 3.2 % stroke rate/year from a score of 3  Above score calculated as 1 point each if present [CHF, HTN, DM, Vascular=MI/PAD/Aortic Plaque, Age if 65-74, or Male] Above score calculated as 2 points each if present [Age > 75, or Stroke/TIA/TE] TSH was low but free T4 was normal in 2015.  He feels that he will still be unable to afford Xarelto.  I discussed with him applying to the company for assistance.   Continue diltiazem.    He apparently had an echo with Dr. Algie CofferKadakia per his report.     1. Obesity: Recommend weight loss.  THis will help with his blood sugar.  He needs to be more careful with his diet to lose weight and help his DM.  We talked about switching to wheat bread. 2. Hypertension: Blood pressure well controlled. 3. Obstructive sleep apnea: This may be contributing to atrial fibrillation. 4. Prior CKD: Dr. Paulino RilyWolters did a full physical.  Lab work was done.  Metformin was stopped after these results.    Current medicines are reviewed at length with the patient today.  The patient concerns regarding his medicines were addressed.  The  following changes have been made:  No change  Labs/ tests ordered today include:  No orders of the defined types were placed in this encounter.    Recommend 150 minutes/week of aerobic exercise Low fat, low carb, high fiber diet recommended  Disposition:   FU in 3 months with APP.    Signed, Lance MussJayadeep Rakim Moone, MD  11/29/2015 9:26 AM    [facility] [address], [place], [place]  [address] Phone: [phone]; Fax: [phone]

## 2015-12-01 DIAGNOSIS — H25013 Cortical age-related cataract, bilateral: Secondary | ICD-10-CM | POA: Diagnosis not present

## 2015-12-01 DIAGNOSIS — H2513 Age-related nuclear cataract, bilateral: Secondary | ICD-10-CM | POA: Diagnosis not present

## 2015-12-01 DIAGNOSIS — H35033 Hypertensive retinopathy, bilateral: Secondary | ICD-10-CM | POA: Diagnosis not present

## 2015-12-01 DIAGNOSIS — H25043 Posterior subcapsular polar age-related cataract, bilateral: Secondary | ICD-10-CM | POA: Diagnosis not present

## 2015-12-01 DIAGNOSIS — E119 Type 2 diabetes mellitus without complications: Secondary | ICD-10-CM | POA: Diagnosis not present

## 2015-12-02 ENCOUNTER — Encounter: Payer: Self-pay | Admitting: Interventional Cardiology

## 2015-12-14 NOTE — Addendum Note (Signed)
Addended by: Reesa Chew on: 12/14/2015 05:25 PM   Modules accepted: Orders

## 2015-12-22 ENCOUNTER — Telehealth: Payer: Self-pay | Admitting: Interventional Cardiology

## 2015-12-22 DIAGNOSIS — I480 Paroxysmal atrial fibrillation: Secondary | ICD-10-CM

## 2015-12-22 NOTE — Telephone Encounter (Signed)
History of Present Illness: Lawrence Knox is a 65 y.o. male  who has a history of paroxysmal atrial fibrillation, diagnosed in 2015. He had a syncopal spell at that time and his blood sugar was low.  Xarelto was prescribed at the time. However, due to cost, he was not able to get it initially.  He declined any type of extensive workup due to financial constraints. He did not want to take Coumadin.  Echocardiogram, TSH were offered. He had some acute kidney injury at the time.  AVS Reports   Date/Time Report Action User  11/29/2015 9:44 AM After Visit Summary Printed [name], LPN  Patient Instructions   Medication Instructions:  Same-no changes  Labwork: None  Testing/Procedures: None  Follow-Up: Your physician recommends that you schedule a follow-up appointment in: 2 months   Any Other Special Instructions Will Be Listed Below (If Applicable). We will contact you after we receive your lab results from your primary care md to let you know which medication you will start.    Do not see where it was determined to start Xarelto, wil route to [employer].

## 2015-12-22 NOTE — Telephone Encounter (Signed)
New Medication  Patient calling the office for samples of medication:   1.  What medication and dosage are you requesting samples for? Xarelto  2.  Are you currently out of this medication? Pt is currently out of this medication and needs samples until he receives assistance with paying for Xarelto.

## 2015-12-22 NOTE — Telephone Encounter (Signed)
New Message  Pt is calling because he was informed in receiving assistance for his medication Xarelto.  Pt verbalized he cannot afford Xarelto with it being $300.00 per month.  Pt verbalized he's needing helpful instruction on how to get assistance with his medication.  Please follow up with pt.

## 2015-12-23 NOTE — Telephone Encounter (Signed)
No BMet was sent by Dr. Kateri PlummerMorrow, that I could see.  Lipids and urine were checked.  OK to give Xarelto samples at current dose, but would also have him check a BMet and CBC in the office.

## 2015-12-23 NOTE — Telephone Encounter (Signed)
The pt is advised and he is in agreement to come to the office on Monday 8/14 for a BMET and CBC. Also he is advised that I have left his Xarelto samples at the front desk and that he can pick them up when it is convenient for him. He states that he will be here sometime this afternoon.  BMET and CBC ordered and scheduled to be drawn on 8/14.

## 2015-12-23 NOTE — Telephone Encounter (Signed)
**Note De-Identified Lawrence Knox Obfuscation** Clarification: At the pts last OV with Dr Eldridge DaceVaranasi on 11/29/15 the pt was advised that we will contact him after we received his lab results from his primary care md to let him know which medication he will start.  We received lab results from Dr Kateri PlummerMorrow Deboraha Sprang([facility] MD that was filling in for Dr Paulino RilyWolters) from 11/25/15.  Is it ok to give the pt samples of Xarelto as he states that he took his last dose this morning.  Please advise.

## 2015-12-26 ENCOUNTER — Other Ambulatory Visit (INDEPENDENT_AMBULATORY_CARE_PROVIDER_SITE_OTHER): Payer: Medicare Other

## 2015-12-26 DIAGNOSIS — I480 Paroxysmal atrial fibrillation: Secondary | ICD-10-CM | POA: Diagnosis not present

## 2015-12-26 LAB — CBC WITH DIFFERENTIAL/PLATELET
BASOS ABS: 0 {cells}/uL (ref 0–200)
Basophils Relative: 0 %
EOS ABS: 132 {cells}/uL (ref 15–500)
EOS PCT: 2 %
HEMATOCRIT: 41.9 % (ref 38.5–50.0)
Hemoglobin: 13.8 g/dL (ref 13.2–17.1)
LYMPHS ABS: 1386 {cells}/uL (ref 850–3900)
MCH: 31.2 pg (ref 27.0–33.0)
MCHC: 32.9 g/dL (ref 32.0–36.0)
MCV: 94.6 fL (ref 80.0–100.0)
MPV: 9.9 fL (ref 7.5–12.5)
Monocytes Absolute: 792 cells/uL (ref 200–950)
Monocytes Relative: 12 %
NEUTROS ABS: 4290 {cells}/uL (ref 1500–7800)
NEUTROS PCT: 65 %
Platelets: 235 10*3/uL (ref 140–400)
RBC: 4.43 MIL/uL (ref 4.20–5.80)
RDW: 13.8 % (ref 11.0–15.0)
WBC: 6.6 10*3/uL (ref 3.8–10.8)

## 2015-12-26 LAB — BASIC METABOLIC PANEL
BUN: 23 mg/dL (ref 7–25)
CHLORIDE: 103 mmol/L (ref 98–110)
CO2: 28 mmol/L (ref 20–31)
CREATININE: 1.59 mg/dL — AB (ref 0.70–1.25)
Calcium: 8.7 mg/dL (ref 8.6–10.3)
Glucose, Bld: 145 mg/dL — ABNORMAL HIGH (ref 65–99)
Potassium: 4.6 mmol/L (ref 3.5–5.3)
Sodium: 139 mmol/L (ref 135–146)

## 2016-01-03 ENCOUNTER — Encounter: Payer: Self-pay | Admitting: Physician Assistant

## 2016-01-17 ENCOUNTER — Encounter: Payer: Self-pay | Admitting: Physician Assistant

## 2016-01-17 DIAGNOSIS — N183 Chronic kidney disease, stage 3 unspecified: Secondary | ICD-10-CM | POA: Insufficient documentation

## 2016-01-17 DIAGNOSIS — I4892 Unspecified atrial flutter: Secondary | ICD-10-CM | POA: Insufficient documentation
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
## 2016-01-18 ENCOUNTER — Ambulatory Visit (INDEPENDENT_AMBULATORY_CARE_PROVIDER_SITE_OTHER): Payer: Medicare Other | Admitting: Physician Assistant

## 2016-01-18 ENCOUNTER — Encounter (INDEPENDENT_AMBULATORY_CARE_PROVIDER_SITE_OTHER): Payer: Self-pay

## 2016-01-18 ENCOUNTER — Encounter: Payer: Self-pay | Admitting: Physician Assistant

## 2016-01-18 VITALS — BP 132/74 | HR 91 | Ht 71.0 in | Wt 242.4 lb

## 2016-01-18 DIAGNOSIS — I4892 Unspecified atrial flutter: Secondary | ICD-10-CM | POA: Diagnosis not present

## 2016-01-18 DIAGNOSIS — I10 Essential (primary) hypertension: Secondary | ICD-10-CM

## 2016-01-18 DIAGNOSIS — N183 Chronic kidney disease, stage 3 unspecified: Secondary | ICD-10-CM

## 2016-01-18 DIAGNOSIS — I480 Paroxysmal atrial fibrillation: Secondary | ICD-10-CM

## 2016-01-18 DIAGNOSIS — E669 Obesity, unspecified: Secondary | ICD-10-CM

## 2016-01-18 NOTE — Patient Instructions (Addendum)
Medication Instructions:  Your physician recommends that you continue on your current medications as directed. Please refer to the Current Medication list given to you today.   Labwork: None ordered  Testing/Procedures: ,Your physician has requested that you have an echocardiogram (AT YOUR CONVENIENCE)  Echocardiography is a painless test that uses sound waves to create images of your heart. It provides your doctor with information about the size and shape of your heart and how well your heart's chambers and valves are working. This procedure takes approximately one hour. There are no restrictions for this procedure.   Follow-Up: Your physician wants you to follow-up in: 6-8 MONTHS WITH DR. VARANASI   You will receive a reminder letter in the mail two months in advance. If you don't receive a letter, please call our office to schedule the follow-up appointment.    Any Other Special Instructions Will Be Listed Below (If Applicable). Echocardiogram An echocardiogram, or echocardiography, uses sound waves (ultrasound) to produce an image of your heart. The echocardiogram is simple, painless, obtained within a short period of time, and offers valuable information to your health care provider. The images from an echocardiogram can provide information such as:  Evidence of coronary artery disease (CAD).  Heart size.  Heart muscle function.  Heart valve function.  Aneurysm detection.  Evidence of a past heart attack.  Fluid buildup around the heart.  Heart muscle thickening.  Assess heart valve function. LET [facility] KNOW ABOUT:  Any allergies you have.  All medicines you are taking, including vitamins, herbs, eye drops, creams, and over-the-counter medicines.  Previous problems you or members of your family have had with the use of anesthetics.  Any blood disorders you have.  Previous surgeries you have had.  Medical conditions you have.  Possibility of  pregnancy, if this applies. BEFORE THE PROCEDURE  No special preparation is needed. Eat and drink normally.  PROCEDURE   In order to produce an image of your heart, gel will be applied to your chest and a wand-like tool (transducer) will be moved over your chest. The gel will help transmit the sound waves from the transducer. The sound waves will harmlessly bounce off your heart to allow the heart images to be captured in real-time motion. These images will then be recorded.  You may need an IV to receive a medicine that improves the quality of the pictures. AFTER THE PROCEDURE You may return to your normal schedule including diet, activities, and medicines, unless your health care provider tells you otherwise.   This information is not intended to replace advice given to you by your health care provider. Make sure you discuss any questions you have with your health care provider.   Document Released: 04/27/2000 Document Revised: 05/21/2014 Document Reviewed: 01/05/2013 Elsevier Interactive Patient Education [employer] Elsevier Inc.     If you need a refill on your cardiac medications before your next appointment, please call your pharmacy.

## 2016-02-27 DIAGNOSIS — Z794 Long term (current) use of insulin: Secondary | ICD-10-CM | POA: Diagnosis not present

## 2016-02-27 DIAGNOSIS — L981 Factitial dermatitis: Secondary | ICD-10-CM | POA: Diagnosis not present

## 2016-02-27 DIAGNOSIS — Z23 Encounter for immunization: Secondary | ICD-10-CM | POA: Diagnosis not present

## 2016-02-27 DIAGNOSIS — F324 Major depressive disorder, single episode, in partial remission: Secondary | ICD-10-CM | POA: Diagnosis not present

## 2016-02-27 DIAGNOSIS — Z79899 Other long term (current) drug therapy: Secondary | ICD-10-CM | POA: Diagnosis not present

## 2016-02-27 DIAGNOSIS — E119 Type 2 diabetes mellitus without complications: Secondary | ICD-10-CM | POA: Diagnosis not present

## 2016-03-19 ENCOUNTER — Other Ambulatory Visit (HOSPITAL_COMMUNITY): Payer: Medicare Other

## 2016-03-22 ENCOUNTER — Telehealth: Payer: Self-pay | Admitting: Interventional Cardiology

## 2016-03-22 NOTE — Telephone Encounter (Signed)
New message      Pt cannot afford xarelto 20mg .  He is calling to see if we can get assistance from the company.  Please call

## 2016-03-22 NOTE — Telephone Encounter (Signed)
Spoke with patient about assistance. He has Medicare, but no Part D coverage. He is currently out of town, but will download an application from [employer]. We will fill out our portion and have Dr. Eldridge DaceVaranasi sign, then fax. Patient agreeable to this plan.

## 2016-04-03 ENCOUNTER — Telehealth: Payer: Self-pay

## 2016-04-03 NOTE — Telephone Encounter (Signed)
Patient assistance forms for Xarelto 20mg  faxed to [employer].

## 2016-04-19 ENCOUNTER — Other Ambulatory Visit: Payer: Self-pay

## 2016-04-19 MED ORDER — RIVAROXABAN 20 MG PO TABS: 20.0000 mg | ORAL_TABLET | Freq: Every day | ORAL | 6 refills | Status: DC

## 2016-04-30 ENCOUNTER — Telehealth (HOSPITAL_COMMUNITY): Payer: Self-pay | Admitting: Physician Assistant

## 2016-04-30 NOTE — Telephone Encounter (Signed)
Pt cancelled appt on 11/6 Pt was called on 11/29 to reschedule but no VM was set up Tried to call the patient again on 12/18 to reschedule and still no VM was set up. He will be removed from the workqueue.

## 2016-06-15 DIAGNOSIS — J0100 Acute maxillary sinusitis, unspecified: Secondary | ICD-10-CM | POA: Diagnosis not present

## 2016-07-04 ENCOUNTER — Ambulatory Visit: Payer: Medicare Other | Admitting: Interventional Cardiology

## 2016-12-11 DIAGNOSIS — E1121 Type 2 diabetes mellitus with diabetic nephropathy: Secondary | ICD-10-CM | POA: Diagnosis not present

## 2016-12-11 DIAGNOSIS — Z23 Encounter for immunization: Secondary | ICD-10-CM | POA: Diagnosis not present

## 2016-12-11 DIAGNOSIS — Z136 Encounter for screening for cardiovascular disorders: Secondary | ICD-10-CM | POA: Diagnosis not present

## 2016-12-11 DIAGNOSIS — Z125 Encounter for screening for malignant neoplasm of prostate: Secondary | ICD-10-CM | POA: Diagnosis not present

## 2016-12-11 DIAGNOSIS — Z6835 Body mass index (BMI) 35.0-35.9, adult: Secondary | ICD-10-CM | POA: Diagnosis not present

## 2016-12-11 DIAGNOSIS — N183 Chronic kidney disease, stage 3 (moderate): Secondary | ICD-10-CM | POA: Diagnosis not present

## 2016-12-11 DIAGNOSIS — Z0000 Encounter for general adult medical examination without abnormal findings: Secondary | ICD-10-CM | POA: Diagnosis not present

## 2016-12-11 DIAGNOSIS — Z79899 Other long term (current) drug therapy: Secondary | ICD-10-CM | POA: Diagnosis not present

## 2016-12-11 DIAGNOSIS — Z1211 Encounter for screening for malignant neoplasm of colon: Secondary | ICD-10-CM | POA: Diagnosis not present

## 2016-12-11 DIAGNOSIS — M6530 Trigger finger, unspecified finger: Secondary | ICD-10-CM | POA: Diagnosis not present

## 2016-12-11 DIAGNOSIS — Z1159 Encounter for screening for other viral diseases: Secondary | ICD-10-CM | POA: Diagnosis not present

## 2016-12-12 DIAGNOSIS — H25813 Combined forms of age-related cataract, bilateral: Secondary | ICD-10-CM | POA: Diagnosis not present

## 2016-12-18 ENCOUNTER — Telehealth: Payer: Self-pay

## 2016-12-18 NOTE — Telephone Encounter (Signed)
The pt called the office stating that it is time for him to re-apply for pt assistance with Xarelto. He is out of town but will be back in a couple of days. I have advised him that he can print a [employer] pt assistance application offline and drop it off here at the office and I will fax it along with the provider portion of the application to [employer]. He verbalized understanding and thanked me for my assistance.

## 2016-12-21 DIAGNOSIS — Z1211 Encounter for screening for malignant neoplasm of colon: Secondary | ICD-10-CM | POA: Diagnosis not present

## 2016-12-21 DIAGNOSIS — Z1159 Encounter for screening for other viral diseases: Secondary | ICD-10-CM | POA: Diagnosis not present

## 2016-12-24 DIAGNOSIS — H25812 Combined forms of age-related cataract, left eye: Secondary | ICD-10-CM | POA: Diagnosis not present

## 2016-12-27 ENCOUNTER — Other Ambulatory Visit: Payer: Self-pay | Admitting: Interventional Cardiology

## 2017-01-01 DIAGNOSIS — H25812 Combined forms of age-related cataract, left eye: Secondary | ICD-10-CM | POA: Diagnosis not present

## 2017-01-03 DIAGNOSIS — H25812 Combined forms of age-related cataract, left eye: Secondary | ICD-10-CM | POA: Diagnosis not present

## 2017-01-03 DIAGNOSIS — Z794 Long term (current) use of insulin: Secondary | ICD-10-CM | POA: Diagnosis not present

## 2017-01-03 DIAGNOSIS — G4730 Sleep apnea, unspecified: Secondary | ICD-10-CM | POA: Diagnosis not present

## 2017-01-03 DIAGNOSIS — E1136 Type 2 diabetes mellitus with diabetic cataract: Secondary | ICD-10-CM | POA: Diagnosis not present
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
## 2017-02-25 ENCOUNTER — Ambulatory Visit: Payer: Medicare Other | Admitting: Interventional Cardiology

## 2017-02-26 ENCOUNTER — Encounter: Payer: Self-pay | Admitting: Interventional Cardiology

## 2017-03-07 DIAGNOSIS — H25811 Combined forms of age-related cataract, right eye: Secondary | ICD-10-CM | POA: Diagnosis not present

## 2017-03-14 DIAGNOSIS — H5231 Anisometropia: Secondary | ICD-10-CM | POA: Diagnosis not present

## 2017-03-14 DIAGNOSIS — E1136 Type 2 diabetes mellitus with diabetic cataract: Secondary | ICD-10-CM | POA: Diagnosis not present

## 2017-03-14 DIAGNOSIS — H25811 Combined forms of age-related cataract, right eye: Secondary | ICD-10-CM | POA: Diagnosis not present

## 2017-03-14 DIAGNOSIS — Z794 Long term (current) use of insulin: Secondary | ICD-10-CM | POA: Diagnosis not present

## 2017-04-27 ENCOUNTER — Other Ambulatory Visit: Payer: Self-pay | Admitting: Interventional Cardiology

## 2017-04-29 NOTE — Telephone Encounter (Signed)
Pt last saw Dr Eldridge DaceVaranasi 11/29/15, overdue for follow-up, called pt made OV for 06/13/17 with Dr Eldridge DaceVaranasi, soonest available appt.  Last labs 12/26/15 Creat 1.59, needs repeat CBC and BMP as well, placed note on appt to draw BMP and CBC at OV on 06/13/17.  Age 66, weight 110kg, previous CrCl was 71.1.  Based on previous CrCl pt is on appropriate dosage of Xarelto 20mg  QD.  Will refill rx x 2 months to get to OV with Dr Eldridge DaceVaranasi on 06/13/17, then will refill at that time, after appropriate dosage verified.

## 2017-05-06 ENCOUNTER — Telehealth: Payer: Self-pay | Admitting: Interventional Cardiology

## 2017-05-06 NOTE — Telephone Encounter (Signed)
Patient calling the office for samples of medication: ° ° °1.  What medication and dosage are you requesting samples for? Xarelto 20 mg ° °2.  Are you currently out of this medication? yes ° ° °

## 2017-05-06 NOTE — Telephone Encounter (Signed)
Called pt to inform him that I was leaving 2 weeks supply of Xarelto 20 mg tablets and patient assistance forms at the front desk for pt to pick up. I informed the pt that if he could please return that forms as soon as possible. I advised the pt that if he has any other problems, questions or concerns to call the office. Pt verbalized understanding.

## 2017-05-13 ENCOUNTER — Telehealth: Payer: Self-pay

## 2017-05-13 NOTE — Telephone Encounter (Signed)
**Note De-Identified Sukaina Toothaker Obfuscation** I attempted to do a PA for Xarelto through covermymeds but was unable and was provided a number to call [phone] to do PA over the phone. I called the number and was advised by [phone] MinisterLilian that the pt needs to contact [employer] and [employer] at [phone].  I called [facility] pharmacy and made them aware. I provided them with the phone number for [employer] and [employer] Pt assistance foundation as they state that they will notify the pt..Marland Kitchen

## 2017-05-16 DIAGNOSIS — M1711 Unilateral primary osteoarthritis, right knee: Secondary | ICD-10-CM | POA: Diagnosis not present

## 2017-05-16 DIAGNOSIS — M25561 Pain in right knee: Secondary | ICD-10-CM | POA: Diagnosis not present

## 2017-05-16 DIAGNOSIS — M25569 Pain in unspecified knee: Secondary | ICD-10-CM | POA: Diagnosis not present

## 2017-05-17 ENCOUNTER — Telehealth: Payer: Self-pay | Admitting: Interventional Cardiology

## 2017-05-17 NOTE — Telephone Encounter (Signed)
Walk in Pt Form-Patient Assistance Program Appication paper dropped off placed In [place] doc box.

## 2017-05-20 ENCOUNTER — Telehealth: Payer: Self-pay | Admitting: Interventional Cardiology

## 2017-05-20 NOTE — Telephone Encounter (Signed)
New message ° ° °Patient calling the office for samples of medication: ° ° °1.  What medication and dosage are you requesting samples for?XARELTO 20 MG TABS tablet ° °2.  Are you currently out of this medication? yes ° ° ° ° °

## 2017-05-20 NOTE — Telephone Encounter (Signed)
I have completed the providers part of the [place] [employer] [place] pt assistance application for Xarelto. I have given the application to Dr Hoyle BarrVaranasi's nurse awaiting his signature.

## 2017-05-20 NOTE — Telephone Encounter (Signed)
Follow up   Patient says that as long as he doesn't have medicare D the info is not needed and that the form discussed just needs to be filled out and sent in to [facility] and [employer]

## 2017-05-20 NOTE — Telephone Encounter (Signed)
**Note De-Identified Stephenia Vogan Obfuscation** The pt returned his [employer] pt assistance application without his 2018 proof of income or his 3% out of pocket expence report from his pharmacy.  He states that he spoke with someone at [facility] and [place] pt assistance program who advised him that all he had to do was check the box that he did not file federal taxes in 2018 and that they never told him that they need an out of pocket expense report from his pharmacy..   I went over the instruction listed on the [facility] and johnson application for pt assistance which the pt should have been given but he said he did not receive.  He states that he will call [employer] and [employer] pt assistance once he gets home to find out what they need in order for him to be approved or pt assistance. He is advised that I will hold on to his application that he left at the office this am and for him to call me if there is anything I can do to help him with this matter.  He verbalized understanding and asked for a 7 day supply of Xarelto until he knows what he needs to do to get pt assistance.  I have advised him that I will leave him 1 sample bottle of Xarelto in our front office and that he can pick up at his convenience.

## 2017-05-21 NOTE — Telephone Encounter (Signed)
Dr Eldridge DaceVaranasi has signed the application and I have faxed it to the [employer] pt assistance program.

## 2017-05-28 NOTE — Telephone Encounter (Signed)
The pt is advised to contact [employer] and [facility] to check progress of his application. He is advised that as long as he is trying to get pt assistance for his Xarelto we will try to give him Xarelto samples, if we have them, until we get determination from [place] and [place].  He states that he is calling them now and that if he needs samples he will call me back.

## 2017-05-28 NOTE — Telephone Encounter (Signed)
New message  Pt verbalized that he is calling for RN  To see about his application for his Xarelto medication

## 2017-05-31 NOTE — Telephone Encounter (Addendum)
**Note De-Identified Lawrence Knox Obfuscation** The pt states that he was advised by [employer] and [employer] that they have not made a determination yet concerning his pt assistance for Xarelto.  I have educated the pt on the importance of taking his Xarelto as directed and not to miss any doses. He is advised to contact us if he is running out of his Xarelto in the future and that if we have Xarelto samples we will give him enough until we hear back from [place] and [place].  He verbalized understanding and thanked me for my help.  Also, he is advised that I have left him 2 bottles of Xarelto samples in the front office. He states that he will pick up today.

## 2017-05-31 NOTE — Telephone Encounter (Signed)
Follow up     Patient has been without Xarelto for a week, needs samples until he hears back from [facility] and [place]   Patient calling the office for samples of medication:   1.  What medication and dosage are you requesting samples for?  Xarelto  20mg   2.  Are you currently out of this medication? yes

## 2017-06-04 DIAGNOSIS — Z794 Long term (current) use of insulin: Secondary | ICD-10-CM | POA: Diagnosis not present

## 2017-06-04 DIAGNOSIS — E1165 Type 2 diabetes mellitus with hyperglycemia: Secondary | ICD-10-CM | POA: Diagnosis not present

## 2017-06-04 DIAGNOSIS — E119 Type 2 diabetes mellitus without complications: Secondary | ICD-10-CM | POA: Diagnosis not present

## 2017-06-10 NOTE — Telephone Encounter (Signed)
**Note De-Identified Makana Rostad Obfuscation** Approval on Xarelto pt assistance from [employer]. Approval good for up to 1 year of (06/06/17-06/06/18).

## 2017-06-13 ENCOUNTER — Ambulatory Visit: Payer: Medicare Other | Admitting: Interventional Cardiology
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
## 2017-07-02 ENCOUNTER — Encounter: Payer: Self-pay | Admitting: Interventional Cardiology

## 2017-07-02 ENCOUNTER — Ambulatory Visit (INDEPENDENT_AMBULATORY_CARE_PROVIDER_SITE_OTHER): Payer: Medicare Other | Admitting: Interventional Cardiology

## 2017-07-02 VITALS — BP 134/80 | HR 71 | Ht 71.0 in | Wt 254.4 lb

## 2017-07-02 DIAGNOSIS — N183 Chronic kidney disease, stage 3 unspecified: Secondary | ICD-10-CM

## 2017-07-02 DIAGNOSIS — I480 Paroxysmal atrial fibrillation: Secondary | ICD-10-CM | POA: Diagnosis not present

## 2017-07-02 DIAGNOSIS — I10 Essential (primary) hypertension: Secondary | ICD-10-CM

## 2017-07-02 DIAGNOSIS — M25561 Pain in right knee: Secondary | ICD-10-CM | POA: Diagnosis not present

## 2017-07-02 DIAGNOSIS — I4892 Unspecified atrial flutter: Secondary | ICD-10-CM

## 2017-07-02 LAB — BASIC METABOLIC PANEL
BUN/Creatinine Ratio: 20 (ref 10–24)
BUN: 29 mg/dL — ABNORMAL HIGH (ref 8–27)
CHLORIDE: 102 mmol/L (ref 96–106)
CO2: 23 mmol/L (ref 20–29)
Calcium: 8.9 mg/dL (ref 8.6–10.2)
Creatinine, Ser: 1.45 mg/dL — ABNORMAL HIGH (ref 0.76–1.27)
GFR calc non Af Amer: 49 mL/min/{1.73_m2} — ABNORMAL LOW (ref >59)
GFR, EST AFRICAN AMERICAN: 57 mL/min/{1.73_m2} — AB (ref >59)
GLUCOSE: 109 mg/dL — AB (ref 65–99)
POTASSIUM: 4.9 mmol/L (ref 3.5–5.2)
Sodium: 139 mmol/L (ref 134–144)

## 2017-07-02 LAB — CBC
Hematocrit: 45.1 % (ref 37.5–51.0)
Hemoglobin: 15.5 g/dL (ref 13.0–17.7)
MCH: 31.6 pg (ref 26.6–33.0)
MCHC: 34.4 g/dL (ref 31.5–35.7)
MCV: 92 fL (ref 79–97)
PLATELETS: 240 10*3/uL (ref 150–379)
RBC: 4.9 x10E6/uL (ref 4.14–5.80)
RDW: 13.3 % (ref 12.3–15.4)
WBC: 7.3 10*3/uL (ref 3.4–10.8)

## 2017-07-02 NOTE — Patient Instructions (Signed)
Medication Instructions:  Your physician recommends that you continue on your current medications as directed. Please refer to the Current Medication list given to you today.   Labwork: TODAY: CBC, BMET  Testing/Procedures: None ordered  Follow-Up: Your physician wants you to follow-up in: 6 months with APP on Dr. Hoyle BarrVaranasi's team. Bonita QuinYou will receive a reminder letter in the mail two months in advance. If you don't receive a letter, please call our office to schedule the follow-up appointment.   Your physician wants you to follow-up in: 1 year with Dr. Eldridge DaceVaranasi. You will receive a reminder letter in the mail two months in advance. If you don't receive a letter, please call our office to schedule the follow-up appointment.   Any Other Special Instructions Will Be Listed Below (If Applicable).     If you need a refill on your cardiac medications before your next appointment, please call your pharmacy.

## 2017-08-05 ENCOUNTER — Other Ambulatory Visit: Payer: Self-pay | Admitting: Interventional Cardiology

## 2017-08-05 NOTE — Telephone Encounter (Signed)
Xarelto 20mg  refill request received; pt is 67 yrs old, wt- 115.4kg, Crea-1.45 on 07/02/17, last seen by Dr. Eldridge DaceVaranasi on 07/02/17, CrCl-80.2069ml/min. Will send in refill to requested pharmacy.

## 2017-08-07 ENCOUNTER — Other Ambulatory Visit: Payer: Self-pay | Admitting: Interventional Cardiology

## 2017-11-26 DIAGNOSIS — J329 Chronic sinusitis, unspecified: Secondary | ICD-10-CM | POA: Diagnosis not present

## 2017-11-26 DIAGNOSIS — H01009 Unspecified blepharitis unspecified eye, unspecified eyelid: Secondary | ICD-10-CM | POA: Diagnosis not present

## 2018-03-17 DIAGNOSIS — S86912A Strain of unspecified muscle(s) and tendon(s) at lower leg level, left leg, initial encounter: Secondary | ICD-10-CM | POA: Diagnosis not present

## 2018-03-20 DIAGNOSIS — E119 Type 2 diabetes mellitus without complications: Secondary | ICD-10-CM | POA: Diagnosis not present

## 2018-03-20 DIAGNOSIS — M19072 Primary osteoarthritis, left ankle and foot: Secondary | ICD-10-CM | POA: Diagnosis not present

## 2018-03-20 DIAGNOSIS — I10 Essential (primary) hypertension: Secondary | ICD-10-CM | POA: Diagnosis not present

## 2018-03-20 DIAGNOSIS — J449 Chronic obstructive pulmonary disease, unspecified: Secondary | ICD-10-CM | POA: Diagnosis not present

## 2018-03-20 DIAGNOSIS — S8002XA Contusion of left knee, initial encounter: Secondary | ICD-10-CM | POA: Diagnosis not present

## 2018-03-20 DIAGNOSIS — M1712 Unilateral primary osteoarthritis, left knee: Secondary | ICD-10-CM | POA: Diagnosis not present

## 2018-03-20 DIAGNOSIS — S93106A Unspecified dislocation of unspecified toe(s), initial encounter: Secondary | ICD-10-CM | POA: Diagnosis not present

## 2018-03-20 DIAGNOSIS — S8991XA Unspecified injury of right lower leg, initial encounter: Secondary | ICD-10-CM | POA: Diagnosis not present

## 2018-03-20 DIAGNOSIS — J019 Acute sinusitis, unspecified: Secondary | ICD-10-CM | POA: Diagnosis not present

## 2018-03-20 DIAGNOSIS — I4891 Unspecified atrial fibrillation: Secondary | ICD-10-CM | POA: Diagnosis not present

## 2018-03-20 DIAGNOSIS — M7732 Calcaneal spur, left foot: Secondary | ICD-10-CM | POA: Diagnosis not present

## 2018-03-20 DIAGNOSIS — S92322A Displaced fracture of second metatarsal bone, left foot, initial encounter for closed fracture: Secondary | ICD-10-CM | POA: Diagnosis not present

## 2018-03-20 DIAGNOSIS — M1711 Unilateral primary osteoarthritis, right knee: Secondary | ICD-10-CM | POA: Diagnosis not present

## 2018-03-20 DIAGNOSIS — F329 Major depressive disorder, single episode, unspecified: Secondary | ICD-10-CM | POA: Diagnosis not present

## 2018-03-20 DIAGNOSIS — R600 Localized edema: Secondary | ICD-10-CM | POA: Diagnosis not present

## 2018-03-20 DIAGNOSIS — M1990 Unspecified osteoarthritis, unspecified site: Secondary | ICD-10-CM | POA: Diagnosis not present

## 2018-03-20 DIAGNOSIS — Z882 Allergy status to sulfonamides status: Secondary | ICD-10-CM | POA: Diagnosis not present

## 2018-03-20 DIAGNOSIS — N390 Urinary tract infection, site not specified: Secondary | ICD-10-CM | POA: Diagnosis not present

## 2018-03-20 DIAGNOSIS — R05 Cough: Secondary | ICD-10-CM | POA: Diagnosis not present

## 2018-03-20 DIAGNOSIS — J329 Chronic sinusitis, unspecified: Secondary | ICD-10-CM | POA: Diagnosis not present

## 2018-03-20 DIAGNOSIS — M25562 Pain in left knee: Secondary | ICD-10-CM | POA: Diagnosis not present

## 2018-03-27 DIAGNOSIS — E1121 Type 2 diabetes mellitus with diabetic nephropathy: Secondary | ICD-10-CM | POA: Diagnosis not present

## 2018-03-27 DIAGNOSIS — Z7984 Long term (current) use of oral hypoglycemic drugs: Secondary | ICD-10-CM | POA: Diagnosis not present

## 2018-03-27 DIAGNOSIS — Z1211 Encounter for screening for malignant neoplasm of colon: Secondary | ICD-10-CM | POA: Diagnosis not present

## 2018-03-27 DIAGNOSIS — E11621 Type 2 diabetes mellitus with foot ulcer: Secondary | ICD-10-CM | POA: Diagnosis not present

## 2018-04-15 ENCOUNTER — Ambulatory Visit (INDEPENDENT_AMBULATORY_CARE_PROVIDER_SITE_OTHER): Payer: Medicare Other | Admitting: Podiatry

## 2018-04-15 ENCOUNTER — Ambulatory Visit (INDEPENDENT_AMBULATORY_CARE_PROVIDER_SITE_OTHER): Payer: Medicare Other

## 2018-04-15 DIAGNOSIS — M2041 Other hammer toe(s) (acquired), right foot: Secondary | ICD-10-CM | POA: Diagnosis not present

## 2018-04-15 DIAGNOSIS — Z7901 Long term (current) use of anticoagulants: Secondary | ICD-10-CM

## 2018-04-15 DIAGNOSIS — M79675 Pain in left toe(s): Secondary | ICD-10-CM

## 2018-04-15 DIAGNOSIS — B351 Tinea unguium: Secondary | ICD-10-CM

## 2018-04-15 DIAGNOSIS — L97529 Non-pressure chronic ulcer of other part of left foot with unspecified severity: Secondary | ICD-10-CM

## 2018-04-15 DIAGNOSIS — M79674 Pain in right toe(s): Secondary | ICD-10-CM | POA: Diagnosis not present

## 2018-04-15 DIAGNOSIS — L97509 Non-pressure chronic ulcer of other part of unspecified foot with unspecified severity: Secondary | ICD-10-CM

## 2018-04-15 DIAGNOSIS — M2042 Other hammer toe(s) (acquired), left foot: Secondary | ICD-10-CM | POA: Diagnosis not present

## 2018-04-15 DIAGNOSIS — E11621 Type 2 diabetes mellitus with foot ulcer: Secondary | ICD-10-CM | POA: Diagnosis not present

## 2018-04-15 MED ORDER — MUPIROCIN 2 % EX OINT: 1.0000 "application " | TOPICAL_OINTMENT | Freq: Two times a day (BID) | CUTANEOUS | 2 refills | Status: AC

## 2018-04-22 NOTE — Progress Notes (Signed)
Subjective:   Patient ID: Lawrence Knox, male   DOB: 67 y.o.   MRN: [id number]   HPI 67 year old male presents the office today for concerns of a wound to his left fourth toe which is been ongoing for last 2 weeks is been gradually getting worse.  He states that his legs swell due to his blood pressure and when they swell a crack in the clinic.  He states this happens every time he is a flareup of his leg.  He also is concerned of possible nail fungus which is been on the last 2 to 3 years.  He is diabetic he is unsure of his blood sugar.   Review of Systems  All other systems reviewed and are negative.  Past Medical History:  Diagnosis Date  . Anxiety   . Asthma   . Atrial flutter (HCC)   . CKD (chronic kidney disease), stage II   . Diabetes mellitus type 2 in obese (HCC)   . Financial difficulties   . Hearing loss   . Hyperlipidemia   . Hypertension   . OSA on CPAP   . PAF (paroxysmal atrial fibrillation) (HCC)    a. Dx 02/2014 in setting of syncope.  . Syncope    a. 02/2014 felt secondary to hypoglycemia, compounded by AF RVR.    Past Surgical History:  Procedure Laterality Date  . None       Current Outpatient Medications:  .  Ascorbic Acid (VITAMIN C) 1000 MG tablet, Take 1,000 mg by mouth daily., Disp: , Rfl:  .  buPROPion (ZYBAN) 150 MG 12 hr tablet, Take 150 mg by mouth daily., Disp: , Rfl:  .  CINNAMON PO, Take 1 tablet by mouth 2 (two) times daily., Disp: , Rfl:  .  citalopram (CELEXA) 40 MG tablet, Take 40 mg by mouth daily., Disp: , Rfl:  .  diltiazem (CARDIZEM CD) 120 MG 24 hr capsule, Take 1 capsule (120 mg total) by mouth daily., Disp: 30 capsule, Rfl: 11 .  escitalopram (LEXAPRO) 10 MG tablet, Take 15 mg by mouth daily. , Disp: , Rfl:  .  fish oil-omega-3 fatty acids 1000 MG capsule, Take 2 g by mouth daily., Disp: , Rfl:  .  glipiZIDE (GLUCOTROL) 5 MG tablet, Take 5 mg by mouth daily., Disp: , Rfl:  .  glucosamine-chondroitin 500-400 MG tablet, Take 1  tablet by mouth 2 (two) times daily., Disp: , Rfl:  .  insulin NPH (HUMULIN N,NOVOLIN N) 100 UNIT/ML injection, Inject 35-75 Units into the skin 2 (two) times daily. 75 am and 35 pm, Disp: , Rfl:  .  lisinopril (PRINIVIL,ZESTRIL) 5 MG tablet, Take 5 mg by mouth daily., Disp: , Rfl:  .  Multiple Vitamin (MULITIVITAMIN WITH MINERALS) TABS, Take 2 tablets by mouth daily., Disp: , Rfl:  .  mupirocin ointment (BACTROBAN) 2 %, Apply 1 application topically 2 (two) times daily., Disp: 30 g, Rfl: 2 .  Nutritional Supplements (DHEA PO), Take 1 tablet by mouth daily., Disp: , Rfl:  .  vitamin E 400 UNIT capsule, Take 400 Units by mouth daily., Disp: , Rfl:  .  XARELTO 20 MG TABS tablet, TAKE 1 TABLET BY MOUTH ONCE DAILY, Disp: 30 tablet, Rfl: 5 .  XARELTO 20 MG TABS tablet, TAKE 1 TABLET BY MOUTH ONCE DAILY., Disp: 90 tablet, Rfl: 3  Allergies  Allergen Reactions  . Sulfa Antibiotics Itching  . Sulfonamide Derivatives Rash    REACTION: rash  Objective:  Physical Exam  General: AAO x3, NAD  Dermatological: Superficial wounds are present to the distal aspect of the toes in the left foot and there is a granular wound base.  There is no probing to bone, undermining or tunneling.  Mild edema to the toes but there is no significant erythema there is no warmth or any ascending cellulitis.  Chronic edema bilateral lower extremities no other skin lesions are present.  Nails are hypertrophic, dystrophic, discolored, elongated skin.  Subjectively to cause irritation issues.  Vascular: Dorsalis Pedis artery and Posterior Tibial artery pedal pulses are palpable bilateral with immedate capillary fill time. There is no pain with calf compression, swelling, warmth, erythema.   Neruologic: Grossly intact via light touch bilateral.  Protective threshold with Semmes Wienstein monofilament intact to all pedal sites bilateral.   Musculoskeletal: Hammertoe contractures are present which contributes to the wound.   Muscular strength 5/5 in all groups tested bilateral.  Gait: Unassisted, Nonantalgic.       Assessment:   Ulcerations left foot, symptomatic onychomycosis on Xarelto     Plan:  -Treatment options discussed including all alternatives, risks, and complications -Etiology of symptoms were discussed -X-rays were obtained reviewed.  No definitive evidence of acute osteomyelitis or soft tissue emphysema identified at this time. -I did debride the wound today to clean them.  They are healthy with a granular wound base.  Plan continue mupirocin ointment dressing changes daily which I prescribed today as well as offloading at all times. -Nails debrided x10 without any complications or bleeding.  Discussed various treatment options for the nail fungus however given his other comorbidities and the significant dystrophy of the nails I do not think that medications would be helpful for him at this point.  Recommend periodic debridements. -Monitor for any clinical signs or symptoms of infection and directed to call the office immediately should any occur or go to the ER.  Vivi Barrack DPM

## 2018-04-23 DIAGNOSIS — E11621 Type 2 diabetes mellitus with foot ulcer: Secondary | ICD-10-CM | POA: Diagnosis not present

## 2018-04-23 DIAGNOSIS — E1121 Type 2 diabetes mellitus with diabetic nephropathy: Secondary | ICD-10-CM | POA: Diagnosis not present

## 2018-04-23 DIAGNOSIS — L03116 Cellulitis of left lower limb: Secondary | ICD-10-CM | POA: Diagnosis not present

## 2018-04-23 DIAGNOSIS — Z794 Long term (current) use of insulin: Secondary | ICD-10-CM | POA: Diagnosis not present

## 2018-04-25 ENCOUNTER — Ambulatory Visit (INDEPENDENT_AMBULATORY_CARE_PROVIDER_SITE_OTHER): Payer: Medicare Other | Admitting: Podiatry

## 2018-04-25 ENCOUNTER — Encounter: Payer: Self-pay | Admitting: Podiatry

## 2018-04-25 VITALS — Temp 98.3°F

## 2018-04-25 DIAGNOSIS — L97509 Non-pressure chronic ulcer of other part of unspecified foot with unspecified severity: Secondary | ICD-10-CM | POA: Diagnosis not present

## 2018-04-25 DIAGNOSIS — M2041 Other hammer toe(s) (acquired), right foot: Secondary | ICD-10-CM | POA: Diagnosis not present

## 2018-04-25 DIAGNOSIS — M2042 Other hammer toe(s) (acquired), left foot: Secondary | ICD-10-CM

## 2018-04-25 DIAGNOSIS — L97529 Non-pressure chronic ulcer of other part of left foot with unspecified severity: Secondary | ICD-10-CM | POA: Diagnosis not present

## 2018-04-25 DIAGNOSIS — E11621 Type 2 diabetes mellitus with foot ulcer: Secondary | ICD-10-CM

## 2018-04-30 NOTE — Progress Notes (Signed)
Subjective: 67 year old male presents the office today for follow evaluation of wounds to his left second and fourth toes.  He states there is about the same.  He states that because of the location he cannot take the pressure off of the toes.  Is been keeping antibiotic ointment dressings on them daily.  Denies any drainage or pus coming from the area.  Some mild bleeding at times but no pus.  No red streaks.  He does admit that as a last saw him he is doing a lot of walking.  He is on antibiotic for cellulitis of the left leg.  Is on amoxicillin.  This was prescribed by his primary care physician. Denies any systemic complaints such as fevers, chills, nausea, vomiting. No acute changes since last appointment, and no other complaints at this time.   Objective: AAO x3, NAD DP/PT pulses palpable bilaterally, CRT less than 3 seconds Continuation of wound to the distal aspect left second and fourth toes which appear to be grossly unchanged.  There is a granular wound present.  There is no fluctuation or crepitation.  Mild erythema but there is no ascending cellulitis.  No significant erythema or cellulitis identified to the legs bilaterally. Hammertoes present. No open lesions or pre-ulcerative lesions.  No pain with calf compression, swelling, warmth, erythema  Assessment: Ulcerations left foot x2  Plan: -All treatment options discussed with the patient including all alternatives, risks, complications.  -I did clean the wounds today.  Overall exam but the same.  We will switch to Medihoney dressing changes daily.  I did give him some today.  Also dispensed a surgical shoe with a Pegassit.  He states that he cannot afford to do this however I did give this to him as a Research officer, political partycourtesy. Doreatha Martin-Finish course of antibiotics -Monitor for any clinical signs or symptoms of infection and directed to call the office immediately should any occur or go to the ER. -Patient encouraged to call the office with any questions,  concerns, change in symptoms.   Vivi BarrackMatthew R Manuela Halbur DPM

## 2018-05-05 ENCOUNTER — Ambulatory Visit (INDEPENDENT_AMBULATORY_CARE_PROVIDER_SITE_OTHER): Payer: Medicare Other | Admitting: Podiatry

## 2018-05-05 DIAGNOSIS — N183 Chronic kidney disease, stage 3 (moderate): Secondary | ICD-10-CM | POA: Diagnosis not present

## 2018-05-05 DIAGNOSIS — L97921 Non-pressure chronic ulcer of unspecified part of left lower leg limited to breakdown of skin: Secondary | ICD-10-CM | POA: Diagnosis not present

## 2018-05-05 DIAGNOSIS — M2042 Other hammer toe(s) (acquired), left foot: Secondary | ICD-10-CM | POA: Diagnosis not present

## 2018-05-05 DIAGNOSIS — L97529 Non-pressure chronic ulcer of other part of left foot with unspecified severity: Secondary | ICD-10-CM | POA: Diagnosis not present

## 2018-05-05 DIAGNOSIS — M2041 Other hammer toe(s) (acquired), right foot: Secondary | ICD-10-CM

## 2018-05-15 NOTE — Progress Notes (Signed)
Subjective: 68 year old male presents the office today for follow evaluation of wounds to his left second and fourth toes.  Overall he states that he is getting better but the wounds are still present.  Denies any drainage or pus come from the area.  He is remained in the surgical shoe with the offloading pad the left side.  He has been trying to put a dressing on the area daily.  He is continued with Medihoney dressing changes.  Denies any increase in swelling or redness. Denies any systemic complaints such as fevers, chills, nausea, vomiting. No acute changes since last appointment, and no other complaints at this time.   Objective: AAO x3, NAD DP/PT pulses palpable bilaterally, CRT less than 3 seconds Continuation of wound to the distal aspect left second and fourth toes which appear to to be healing.  There are superficial and there is no probing to bone, undermining or tunneling.  There is no surrounding erythema, ascending cellulitis.  There is no fluctuation or crepitation or any malodor.  No clinical signs of infection are noted today.  Hammertoes present. No open lesions or pre-ulcerative lesions.  No pain with calf compression, swelling, warmth, erythema  Assessment: Ulcerations left foot x2  Plan: -All treatment options discussed with the patient including all alternatives, risks, complications.  -I did clean the wounds today.  Wounds are okay.  Continue the surgical shoe with offloading at all times.  Continue daily dressing changes. Will continue with Medihoney dressings.  -Monitor for any clinical signs or symptoms of infection and directed to call the office immediately should any occur or go to the ER. -Patient encouraged to call the office with any questions, concerns, change in symptoms.   Vivi Barrack DPM

## 2018-05-19 ENCOUNTER — Ambulatory Visit (INDEPENDENT_AMBULATORY_CARE_PROVIDER_SITE_OTHER): Payer: Medicare Other | Admitting: Podiatry

## 2018-05-19 ENCOUNTER — Ambulatory Visit (INDEPENDENT_AMBULATORY_CARE_PROVIDER_SITE_OTHER): Payer: Medicare Other

## 2018-05-19 ENCOUNTER — Encounter: Payer: Self-pay | Admitting: Podiatry

## 2018-05-19 VITALS — Temp 98.1°F

## 2018-05-19 DIAGNOSIS — L97529 Non-pressure chronic ulcer of other part of left foot with unspecified severity: Secondary | ICD-10-CM

## 2018-05-19 MED ORDER — DOXYCYCLINE HYCLATE 100 MG PO TABS: 100.0000 mg | ORAL_TABLET | Freq: Two times a day (BID) | ORAL | 0 refills | Status: DC

## 2018-05-26 DIAGNOSIS — M79609 Pain in unspecified limb: Secondary | ICD-10-CM | POA: Diagnosis not present

## 2018-05-26 DIAGNOSIS — L03115 Cellulitis of right lower limb: Secondary | ICD-10-CM | POA: Diagnosis not present

## 2018-05-26 DIAGNOSIS — R600 Localized edema: Secondary | ICD-10-CM | POA: Diagnosis not present

## 2018-05-26 DIAGNOSIS — M79661 Pain in right lower leg: Secondary | ICD-10-CM | POA: Diagnosis not present

## 2018-05-26 DIAGNOSIS — M6282 Rhabdomyolysis: Secondary | ICD-10-CM | POA: Diagnosis not present

## 2018-05-26 DIAGNOSIS — L0390 Cellulitis, unspecified: Secondary | ICD-10-CM | POA: Diagnosis not present

## 2018-05-27 NOTE — Progress Notes (Signed)
Subjective: 68 year old male presents the office today for follow evaluation of wounds to his left second and fourth toes.  Overall he states he is doing better but the plan is to continue.  He denies any drainage or pus coming from the area he denies any redness but occasionally gets some swelling.Marland Kitchen  He has no pain rates.  He has been continue with daily dressing changes with Medihoney.  Denies any systemic complaints such as fevers, chills, nausea, vomiting. No acute changes since last appointment, and no other complaints at this time.   Objective: AAO x3, NAD DP/PT pulses palpable bilaterally, CRT less than 3 seconds Continuation of wound to the distal aspect left second and fourth toes which appear to to be healing.  Upon debridement of the hyperkeratotic tissue which covered the wound on the second toe only small superficial pinpoint wound remains and overall the wounds are getting smaller.  Minimal induration ascending erythema or increase in warmth.  There is no drainage or pus identified at this time.  No fluctuation crepitation any malodor.  Hammertoe contractures are present.  No open lesions or pre-ulcerative lesions.  No pain with calf compression, swelling, warmth, erythema  Assessment: Ulcerations left foot x2  Plan: -All treatment options discussed with the patient including all alternatives, risks, complications.  -X-rays were obtained and reviewed.  No definitive evidence of cortical destruction suggestive of osteomyelitis.  No soft tissue emphysema. -I did clean the wounds today.  Wounds were sharply debrided utilizing a #312 with scalpel without any complications down to healthy granular tissue.  Continue the surgical shoe with offloading at all times.  Continue daily dressing changes. Will switch to Actisorb dressings. -Monitor for any clinical signs or symptoms of infection and directed to call the office immediately should any occur or go to the ER. -Patient encouraged to call  the office with any questions, concerns, change in symptoms.   Vivi Barrack DPM

## 2018-05-29 ENCOUNTER — Ambulatory Visit: Payer: Medicare Other | Admitting: Podiatry

## 2018-06-06 ENCOUNTER — Ambulatory Visit (INDEPENDENT_AMBULATORY_CARE_PROVIDER_SITE_OTHER): Payer: Medicare Other | Admitting: Podiatry

## 2018-06-06 VITALS — Temp 97.8°F

## 2018-06-06 DIAGNOSIS — Q828 Other specified congenital malformations of skin: Secondary | ICD-10-CM

## 2018-06-06 DIAGNOSIS — L97529 Non-pressure chronic ulcer of other part of left foot with unspecified severity: Secondary | ICD-10-CM | POA: Diagnosis not present

## 2018-06-06 DIAGNOSIS — Z7901 Long term (current) use of anticoagulants: Secondary | ICD-10-CM | POA: Diagnosis not present

## 2018-06-06 DIAGNOSIS — E11621 Type 2 diabetes mellitus with foot ulcer: Secondary | ICD-10-CM

## 2018-06-06 DIAGNOSIS — L97509 Non-pressure chronic ulcer of other part of unspecified foot with unspecified severity: Secondary | ICD-10-CM

## 2018-06-11 NOTE — Progress Notes (Signed)
Subjective: 68 year old male presents the office today for follow evaluation of wounds to his left second and fourth toes.  He states the wounds are healing.  Denies any drainage or pus coming from the area he denies any increase in swelling.  He is currently on Keflex for another issue.  He is remained in the surgical shoe on the left side to help offload the toes.  The second toe is doing well.  Also the wound on the left fourth toe is healing but still remains.  No pain.  Denies any fevers, chills, nausea, vomiting.  No calf pain, chest pain, shortness of breath.  Objective: AAO x3, NAD DP/PT pulses palpable bilaterally, CRT less than 3 seconds Continuation of wound to the distal aspect left second and fourth toes which appear to to be healing.  Upon debridement there is only a very small superficial wound present the left second toe.  Wound continues to distal fourth toe however this is improving as well.  This is localized edema to the tip of the toe but there is no erythema or increase in warmth.  There is no ascending cellulitis.  No fluctuation or crepitation identified.  Hammertoe contractures are present.  He also has some calluses submetatarsal area bilaterally and upon debridement there is no underlying ulceration drainage or any signs of infection.   No open lesions or pre-ulcerative lesions.  No pain with calf compression, swelling, warmth, erythema  Assessment: Ulcerations left foot x2  Plan: -All treatment options discussed with the patient including all alternatives, risks, complications.  -I did clean the wounds today.  As instructed debrided today with a #312 with scalpel without any complications of a healthy, granular base.  Plan continue with daily dressing changes.  -He is on Keflex for another issue.  This can be helpful for the foot as well.  -Continue offloading at all times. -Sharp debrided the hyperkeratotic lesions without any complications or bleeding. -Monitor for  any clinical signs or symptoms of infection and directed to call the office immediately should any occur or go to the ER. -Patient encouraged to call the office with any questions, concerns, change in symptoms.   *x-ray left foot next appointment  Vivi Barrack DPM

## 2018-06-13 ENCOUNTER — Encounter: Payer: Self-pay | Admitting: Podiatry

## 2018-06-13 ENCOUNTER — Ambulatory Visit (INDEPENDENT_AMBULATORY_CARE_PROVIDER_SITE_OTHER): Payer: Medicare Other | Admitting: Podiatry

## 2018-06-13 DIAGNOSIS — L97509 Non-pressure chronic ulcer of other part of unspecified foot with unspecified severity: Secondary | ICD-10-CM

## 2018-06-13 DIAGNOSIS — L97529 Non-pressure chronic ulcer of other part of left foot with unspecified severity: Secondary | ICD-10-CM

## 2018-06-13 DIAGNOSIS — Z7901 Long term (current) use of anticoagulants: Secondary | ICD-10-CM

## 2018-06-13 DIAGNOSIS — E11621 Type 2 diabetes mellitus with foot ulcer: Secondary | ICD-10-CM | POA: Diagnosis not present

## 2018-06-13 NOTE — Progress Notes (Signed)
Subjective: 68 year old male presents the office today for follow evaluation of wounds to his left second and fourth toes.  He states that overall he is doing much better.  He states that he is not having any significant swelling or redness and denies any red streaks and the wounds are healing.  He has been keeping antibiotic ointment on the wounds daily.  He has no other concerns today.  Denies any fevers, chills, nausea, vomiting.  No calf pain no chest pain, shortness of breath.  Objective: AAO x3, NAD DP/PT pulses palpable bilaterally, CRT less than 3 seconds Continuation of wound to the distal aspect left second and fourth toes which appear to to be healing.  Overall the wound in the second toe placement was completely healed.  The wound of the fourth toe is doing much better.  Is having no pain to the area and there is decreased edema and there is no erythema or increase in warmth.  There is no ascending cellulitis.  No fluctuation or crepitation malodor. No other open lesions or pre-ulcerative lesions.  No pain with calf compression, swelling, warmth, erythema  Assessment: Ulcerations left foot x2  Plan: -All treatment options discussed with the patient including all alternatives, risks, complications.  -I did clean the wounds today.  As instructed debrided today with a #312 with scalpel without any complications of a healthy, granular base.  Plan to continue with daily dressing changes.  -He states he is doing well he wants to follow-up on x-rays today.  Clinically is no signs of infection he is doing much better. -Monitor for any clinical signs or symptoms of infection and directed to call the office immediately should any occur or go to the ER. -Patient encouraged to call the office with any questions, concerns, change in symptoms.   *x-ray left foot next appointment if wound still present  Vivi Barrack DPM

## 2018-06-18 ENCOUNTER — Ambulatory Visit: Payer: Medicare Other

## 2018-06-20 ENCOUNTER — Telehealth: Payer: Self-pay | Admitting: Interventional Cardiology

## 2018-06-20 NOTE — Telephone Encounter (Signed)
Called pt and pt stated that he needed to fill out pt assistance for Xarelto. I explained to the pt that the pt assistanct coordinator will not be back until Monday and that I would leave a week supply of samples of Xarelto 20 mg tablets and a pt assistant form at the front desk for pt to pick up and if pt could please return it ASAP. Pt verbalized understanding.

## 2018-06-20 NOTE — Telephone Encounter (Signed)
New Message: ° ° ° °Patient calling the office for samples of medication: ° ° °1.  What medication and dosage are you requesting samples for?XARELTO 20 MG TABS tablet ° °2.  Are you currently out of this medication? Yes  ° °

## 2018-06-20 NOTE — Telephone Encounter (Signed)
New Message   Pt c/o medication issue:  1. Name of Medication: Xarelto  2. How are you currently taking this medication (dosage and times per day)?   3. Are you having a reaction (difficulty breathing--STAT)?   4. What is your medication issue? Patient is calling because he is needing assistance with Lawrence Knox and Lawrence Knox for the patient assistance program. Please call.

## 2018-06-23 NOTE — Telephone Encounter (Signed)
The pt brought in his completed [employer] and [place] pt asst application to the office today. I have advised him that once Dr Eldridge Dace signs his application I will fax it to [employer].  I have completed the provider part of the application, Dr Eldridge Dace has signed it and I have faxed it to [facility] and [place].

## 2018-06-30 ENCOUNTER — Ambulatory Visit: Payer: Medicare Other | Admitting: Podiatry

## 2018-07-08 ENCOUNTER — Telehealth: Payer: Self-pay | Admitting: *Deleted

## 2018-07-08 NOTE — Telephone Encounter (Signed)
Pt is scheduled to see Jacolyn Reedy, PA on 07/23/2018. Clearance will be addressed at visit

## 2018-07-08 NOTE — Telephone Encounter (Signed)
Pt takes Xarelto for afib with CHADS2VASc score of 3 (age, HTN, DM). SCr 1.45 last year, CrCl 76mL/min using actual body weight. Ok to hold Xarelto for 1 day prior to oral surgery, he does not require pre op antibiotics.

## 2018-07-08 NOTE — Telephone Encounter (Signed)
I will route to Herma Carson and remove from preop box.

## 2018-07-08 NOTE — Telephone Encounter (Signed)
   [facility] Pre-operative Risk Assessment    Request for surgical clearance:  1. What type of surgery is being performed? ORAL SURGERY INVOLVING SOFT TISSUE AND BONE REMOVAL   2. When is this surgery scheduled? TBD    3. What type of clearance is required (medical clearance vs. Pharmacy clearance to hold med vs. Both)? BOTH  4. Are there any medications that need to be held prior to surgery and how long?Sikes   5. Practice name and name of physician performing surgery? [facility]; DR. Jake Shark, DDS   6. What is your office phone number [phone]    7.   What is your office fax number [phone]  8.   Anesthesia type (None, local, MAC, general) ? NONE LISTED    Julaine Hua 07/08/2018, 12:19 PM  _________________________________________________________________   (provider comments below)

## 2018-07-08 NOTE — Telephone Encounter (Signed)
   Primary Cardiologist:Jayadeep Eldridge Dace, MD  Chart reviewed as part of pre-operative protocol coverage. Because of Lawrence Knox's past medical history and time since last visit (over 1 year), he will require a follow-up visit in order to better assess preoperative cardiovascular risk.  Pre-op covering staff: - Please schedule appointment and call patient to inform them. - Please contact requesting surgeon's office via preferred method (i.e, phone, fax) to inform them of need for appointment prior to surgery.  If applicable, this message will also be routed to pharmacy pool and/or primary cardiologist for input on holding anticoagulant/antiplatelet agent as requested below so that this information is available at time of patient's appointment.   Berton Bon, NP  07/08/2018, 12:42 PM

## 2018-07-15 DIAGNOSIS — N390 Urinary tract infection, site not specified: Secondary | ICD-10-CM | POA: Diagnosis not present

## 2018-07-15 DIAGNOSIS — L97909 Non-pressure chronic ulcer of unspecified part of unspecified lower leg with unspecified severity: Secondary | ICD-10-CM | POA: Diagnosis not present

## 2018-07-15 DIAGNOSIS — M545 Low back pain: Secondary | ICD-10-CM | POA: Diagnosis not present

## 2018-07-15 DIAGNOSIS — F459 Somatoform disorder, unspecified: Secondary | ICD-10-CM | POA: Diagnosis not present

## 2018-07-15 DIAGNOSIS — R300 Dysuria: Secondary | ICD-10-CM | POA: Diagnosis not present

## 2018-07-15 NOTE — Telephone Encounter (Signed)
**Note De-Identified [name] Obfuscation** Letter received Lawrence Knox fax from [employer] stating that they have approved the pt for pt asst with Xarelto. Approval good until 07/14/2019  The letter states that they have notified the pt.

## 2018-07-16 NOTE — Progress Notes (Signed)
Cardiology Office Note    Date:  07/23/2018   ID:  Lawrence Knox Jul 18, 1950, MRN [id number]  PCP:  Lawrence Palmer, MD  Cardiologist: Lance Muss, MD EPS: None  Chief Complaint  Patient presents with  . Pre-op Exam    History of Present Illness:  Lawrence Knox is a 68 y.o. male with history of PAF diagnosed in 2015.  Xarelto was prescribed at the time but he could not afford it.  He declined any extensive work-up because of financial constraints and did not want to take Coumadin.  Echo TSH and GXT were never done.  Syncopal episode in 2015 in the setting of hypokalemia and rapid atrial fib.Patient also has DM and HTN, doesn't know lipid status and father with CVA.  Patient saw Dr. Eldridge Dace 07/02/2017 and was in atrial fib/flutter and recommended Xarelto.  Blood pressure was controlled.  Exercise and weight loss recommended.  Patient on my schedule today for cardiac clearance undergoing oral surgery involving soft tissue and bone removal.  He was cleared by pharmacy to hold Xarelto 1 day prior to oral surgery and does not require preop antibiotics. Patient has chronic dyspnea on exertion because he can't walk due to knee and foot problems. He says he's completely out of shape and says a flight of stairs makes him short of breath. Patient was in [facility] ER in [place], [place] a month ago with leg swelling and foot pain. He was told he was severely dehydrated and had a UTI. They did an echo. Currently being treated for cellulitis.  Foot in a boot because of diabetic foot ulcers.  Denies chest pain, palpitations, dizziness or presyncope.  Past Medical History:  Diagnosis Date  . Acute kidney injury (HCC) 02/26/2014  . Anxiety   . Asthma   . Atrial flutter (HCC)   . Childhood asthma - still with occasional flares 02/12/2014  . CKD (chronic kidney disease), stage II   . CKD (chronic kidney disease), stage III (HCC)   . DEPRESSION 12/10/2006   Qualifier: Diagnosis of  By: Lovell Sheehan  MD, Balinda Quails   . Diabetes mellitus type 2 in obese (HCC)   . Financial difficulties   . Hearing loss   . Hyperlipidemia   . Hypertension   . Hypoglycemia 02/12/2014  . Obesity 02/26/2014  . OSA on CPAP   . PAF (paroxysmal atrial fibrillation) (HCC)    a. Dx 02/2014 in setting of syncope.  . Syncope    a. 02/2014 felt secondary to hypoglycemia, compounded by AF RVR.    Past Surgical History:  Procedure Laterality Date  . None      Current Medications: Current Meds  Medication Sig  . albuterol (PROVENTIL HFA;VENTOLIN HFA) 108 (90 Base) MCG/ACT inhaler Inhale 2 puffs into the lungs every 4 (four) hours as needed.  . Ascorbic Acid (VITAMIN C) 1000 MG tablet Take 1,000 mg by mouth daily.  Marland Kitchen buPROPion (WELLBUTRIN SR) 150 MG 12 hr tablet Take 150 mg by mouth 2 (two) times daily.  Marland Kitchen CINNAMON PO Take 1 tablet by mouth 2 (two) times daily.  . ciprofloxacin (CIPRO) 500 MG tablet Take 500 mg by mouth 2 (two) times daily. for 7 days  . citalopram (CELEXA) 40 MG tablet Take 40 mg by mouth daily.  Marland Kitchen diltiazem (CARDIZEM CD) 120 MG 24 hr capsule Take 1 capsule (120 mg total) by mouth daily.  Marland Kitchen escitalopram (LEXAPRO) 10 MG tablet Take 15 mg by mouth daily.   . fish oil-omega-3  fatty acids 1000 MG capsule Take 2 g by mouth daily.  . furosemide (LASIX) 20 MG tablet TAKE 1 TABLET BY MOUTH ONCE DAILY AS NEEDED FOR EDEMA FOR 30 DAYS  . glipiZIDE (GLUCOTROL) 5 MG tablet Take 5 mg by mouth daily.  Marland Kitchen glucosamine-chondroitin 500-400 MG tablet Take 1 tablet by mouth 2 (two) times daily.  . insulin NPH (HUMULIN N,NOVOLIN N) 100 UNIT/ML injection Inject 35-75 Units into the skin 2 (two) times daily. 75 am and 35 pm  . lisinopril (PRINIVIL,ZESTRIL) 20 MG tablet TAKE 1 TABLET BY MOUTH ONCE DAILY. KEEP APPOINTMENT FOR 4 21 2020  . lisinopril (PRINIVIL,ZESTRIL) 5 MG tablet Take 5 mg by mouth daily.  . Multiple Vitamin (MULITIVITAMIN WITH MINERALS) TABS Take 2 tablets by mouth daily.  . mupirocin ointment  (BACTROBAN) 2 % Apply 1 application topically 2 (two) times daily.  . Nutritional Supplements (DHEA PO) Take 1 tablet by mouth daily.  . vitamin E 400 UNIT capsule Take 400 Units by mouth daily.  Carlena Hurl 20 MG TABS tablet TAKE 1 TABLET BY MOUTH ONCE DAILY     Allergies:   Sulfa antibiotics and Sulfonamide derivatives   Social History   Socioeconomic History  . Marital status: Legally Separated    Spouse name: Not on file  . Number of children: Not on file  . Years of education: Not on file  . Highest education level: Not on file  Occupational History  . Occupation: Counsellor  Social Needs  . Financial resource strain: Not on file  . Food insecurity:    Worry: Not on file    Inability: Not on file  . Transportation needs:    Medical: Not on file    Non-medical: Not on file  Tobacco Use  . Smoking status: Never Smoker  . Smokeless tobacco: Never Used  Substance and Sexual Activity  . Alcohol use: No  . Drug use: No  . Sexual activity: Not Currently  Lifestyle  . Physical activity:    Days per week: Not on file    Minutes per session: Not on file  . Stress: Not on file  Relationships  . Social connections:    Talks on phone: Not on file    Gets together: Not on file    Attends religious service: Not on file    Active member of club or organization: Not on file    Attends meetings of clubs or organizations: Not on file    Relationship status: Not on file  Other Topics Concern  . Not on file  Social History Narrative   Pt lives alone. No family history of cardiac issues.     Family History:  The patient's family history includes CVA (age of onset: 48) in his father; Pancreatitis in his brother.   ROS:   Please see the history of present illness.    Review of Systems  Constitution: Negative.  HENT: Negative.   Cardiovascular: Positive for dyspnea on exertion and leg swelling.  Respiratory: Negative.   Endocrine: Negative.     Hematologic/Lymphatic: Negative.   Musculoskeletal: Positive for back pain and joint pain.  Gastrointestinal: Negative.   Genitourinary: Negative.   Neurological: Negative.   Psychiatric/Behavioral: Positive for depression.   All other systems reviewed and are negative.   PHYSICAL EXAM:   VS:  BP 130/86   Pulse 68   Ht 5\' 11"  (1.803 m)   Wt 260 lb 12.8 oz (118.3 kg)   SpO2 95%   BMI 36.37  kg/m   Physical Exam  GEN: Obese, in no acute distress  Neck: no JVD, carotid bruits, or masses Cardiac: Irregular without murmur Respiratory: Decreased breath sounds but clear to auscultation bilaterally, normal work of breathing GI: soft, nontender, nondistended, + BS Ext: Right leg wrapped from cellulitis left leg +1 brawny edema and foot in boot  MS: no deformity or atrophy  Skin: warm and dry, no rash Neuro:  Alert and Oriented x 3 Psych: Depressed mood, full affect  Wt Readings from Last 3 Encounters:  07/23/18 260 lb 12.8 oz (118.3 kg)  07/02/17 254 lb 6.4 oz (115.4 kg)  01/18/16 242 lb 6.4 oz (110 kg)      Studies/Labs Reviewed:   EKG:  EKG is ordered today.  The ekg ordered today demonstrates atrial flutter at 68 bpm, no acute change  Recent Labs: No results found for requested labs within last 8760 hours.   Lipid Panel No results found for: CHOL, TRIG, HDL, CHOLHDL, VLDL, LDLCALC, LDLDIRECT  Additional studies/ records that were reviewed today include:      ASSESSMENT:    1. Preoperative clearance   2. Atrial flutter, unspecified type (HCC)   3. Essential hypertension   4. Diabetes mellitus type 2 in obese (HCC)   5. CKD (chronic kidney disease), stage III (HCC)   6. Shortness of breath      PLAN:  In order of problems listed above:  Preoperative clearance before undergoing oral surgery involving soft tissue and bone removal by Dr. Shirlee LimerickAmandip Kamoh, DDS. Per pharmacy patient can hold Xarelto for 1 day.  Patient with progressive dyspnea on exertion to the  point where he cannot walk up a flight of stairs without getting short of breath.  He denies chest pain.  He has significant diabetes and was recently in the hospital in [facility] for leg edema, UTI, dehydration and cellulitis.  Given his recent symptoms will obtain records from his emergency room visit which he claims included a 2D echo.  Also recommend Lexiscan Myoview to help risk stratify.  Overall is a low risk surgery that he is requiring but he has never had a cardiac work-up in the past. According to the Revised Cardiac Risk Index (RCRI), his Perioperative Risk of Major Cardiac Event is (%): 0.9  His Functional Capacity in METs is: 4.4 according to the [facility] Activity Status Index (DASI).     Atrial fibrillation/flutter on Xarelto and diltiazem  Essential hypertension blood pressure stable  CKD creatinine 1.45 1 year ago will obtain labs from emergency room visit.    Medication Adjustments/Labs and Tests Ordered: Current medicines are reviewed at length with the patient today.  Concerns regarding medicines are outlined above.  Medication changes, Labs and Tests ordered today are listed in the Patient Instructions below. Patient Instructions  Medication Instructions:  Your provider recommends that you continue on your current medications as directed. Please refer to the Current Medication list given to you today.   If you need a refill on your cardiac medications before your next appointment, please call your pharmacy.   Testing/Procedures: Your provider has requested that you have a lexiscan myoview. For further information please visit [email or web address]. Please follow instruction sheet, as given.  Follow-Up: At [facility], you and your health needs are our priority.  As part of our continuing mission to provide you with exceptional heart care, we have created designated Provider Care Teams.  These Care Teams include your primary Cardiologist (physician) and  Advanced Practice Providers (APPs -  Physician Assistants and Nurse Practitioners) who all work together to provide you with the care you need, when you need it. You will need a follow up appointment in 12 months.  Please call our office 2 months in advance to schedule this appointment.  You may see Lance Muss, MD or one of the following Advanced Practice Providers on your designated Care Team:   [place], PA-C Ronie Spies, PA-C . Jacolyn Reedy, PA-C      Signed, Jacolyn Reedy, PA-C  07/23/2018 9:02 AM    [facility] [address] [place], [place], [place]  [address] Phone: [phone]; Fax: [phone]

## 2018-07-21 ENCOUNTER — Encounter: Payer: Self-pay | Admitting: Podiatry

## 2018-07-21 ENCOUNTER — Ambulatory Visit (INDEPENDENT_AMBULATORY_CARE_PROVIDER_SITE_OTHER): Payer: Medicare Other | Admitting: Podiatry

## 2018-07-21 ENCOUNTER — Ambulatory Visit (INDEPENDENT_AMBULATORY_CARE_PROVIDER_SITE_OTHER): Payer: Medicare Other

## 2018-07-21 VITALS — Temp 97.2°F

## 2018-07-21 DIAGNOSIS — M2041 Other hammer toe(s) (acquired), right foot: Secondary | ICD-10-CM | POA: Diagnosis not present

## 2018-07-21 DIAGNOSIS — L97529 Non-pressure chronic ulcer of other part of left foot with unspecified severity: Secondary | ICD-10-CM | POA: Diagnosis not present

## 2018-07-21 DIAGNOSIS — M2042 Other hammer toe(s) (acquired), left foot: Secondary | ICD-10-CM

## 2018-07-23 ENCOUNTER — Encounter: Payer: Self-pay | Admitting: Physician Assistant

## 2018-07-23 ENCOUNTER — Other Ambulatory Visit: Payer: Self-pay

## 2018-07-23 ENCOUNTER — Ambulatory Visit (INDEPENDENT_AMBULATORY_CARE_PROVIDER_SITE_OTHER): Payer: Medicare Other | Admitting: Physician Assistant

## 2018-07-23 ENCOUNTER — Telehealth (HOSPITAL_COMMUNITY): Payer: Self-pay | Admitting: *Deleted

## 2018-07-23 VITALS — BP 130/86 | HR 68 | Ht 71.0 in | Wt 260.8 lb

## 2018-07-23 DIAGNOSIS — R0602 Shortness of breath: Secondary | ICD-10-CM

## 2018-07-23 DIAGNOSIS — Z01818 Encounter for other preprocedural examination: Secondary | ICD-10-CM | POA: Diagnosis not present

## 2018-07-23 DIAGNOSIS — I4892 Unspecified atrial flutter: Secondary | ICD-10-CM | POA: Diagnosis not present

## 2018-07-23 DIAGNOSIS — E1169 Type 2 diabetes mellitus with other specified complication: Secondary | ICD-10-CM | POA: Diagnosis not present

## 2018-07-23 DIAGNOSIS — I10 Essential (primary) hypertension: Secondary | ICD-10-CM

## 2018-07-23 DIAGNOSIS — N183 Chronic kidney disease, stage 3 unspecified: Secondary | ICD-10-CM

## 2018-07-23 DIAGNOSIS — E669 Obesity, unspecified: Secondary | ICD-10-CM

## 2018-07-23 NOTE — Patient Instructions (Signed)
Medication Instructions:  Your provider recommends that you continue on your current medications as directed. Please refer to the Current Medication list given to you today.   If you need a refill on your cardiac medications before your next appointment, please call your pharmacy.   Testing/Procedures: Your provider has requested that you have a lexiscan myoview. For further information please visit [email or web address] Please follow instruction sheet, as given.  Follow-Up: At [facility], you and your health needs are our priority.  As part of our continuing mission to provide you with exceptional heart care, we have created designated Provider Care Teams.  These Care Teams include your primary Cardiologist (physician) and Advanced Practice Providers (APPs -  Physician Assistants and Nurse Practitioners) who all work together to provide you with the care you need, when you need it. You will need a follow up appointment in 12 months.  Please call our office 2 months in advance to schedule this appointment.  You may see Lance Muss, MD or one of the following Advanced Practice Providers on your designated Care Team:   Seis [place], PA-C Ronie Spies, PA-C . Jacolyn Reedy, PA-C

## 2018-07-23 NOTE — Addendum Note (Signed)
Addended by: Gunnar Fusi A on: 07/23/2018 03:38 PM   Modules accepted: Orders

## 2018-07-23 NOTE — Telephone Encounter (Signed)
Patient given detailed instructions per Myocardial Perfusion Study Information Sheet for the test on 07/29/18 at 1045. Patient notified to arrive 15 minutes early and that it is imperative to arrive on time for appointment to keep from having the test rescheduled.  If you need to cancel or reschedule your appointment, please call the office within 24 hours of your appointment. . Patient verbalized understanding.Lawrence Knox, [name] No mychart

## 2018-07-29 ENCOUNTER — Other Ambulatory Visit: Payer: Self-pay

## 2018-07-29 ENCOUNTER — Ambulatory Visit (HOSPITAL_COMMUNITY): Payer: Medicare Other | Attending: Cardiovascular Disease

## 2018-07-29 DIAGNOSIS — R0602 Shortness of breath: Secondary | ICD-10-CM

## 2018-07-29 DIAGNOSIS — Z01818 Encounter for other preprocedural examination: Secondary | ICD-10-CM | POA: Diagnosis not present

## 2018-07-29 MED ORDER — TECHNETIUM TC 99M TETROFOSMIN IV KIT
30.5000 | PACK | Freq: Once | INTRAVENOUS | Status: AC | PRN
  Administered 2018-07-29: 30.5 via INTRAVENOUS
  Filled 2018-07-29: qty 31

## 2018-07-29 MED ORDER — TECHNETIUM TC 99M TETROFOSMIN IV KIT
9.6000 | PACK | Freq: Once | INTRAVENOUS | Status: AC | PRN
  Administered 2018-07-29: 9.6 via INTRAVENOUS
  Filled 2018-07-29: qty 10

## 2018-07-29 MED ORDER — REGADENOSON 0.4 MG/5ML IV SOLN
0.4000 mg | Freq: Once | INTRAVENOUS | Status: AC
  Administered 2018-07-29: 0.4 mg via INTRAVENOUS

## 2018-07-30 LAB — MYOCARDIAL PERFUSION IMAGING
CHL CUP RESTING HR STRESS: 66 {beats}/min
LV dias vol: 105 mL (ref 62–150)
LV sys vol: 35 mL
Peak HR: 76 {beats}/min
SDS: 0
SRS: 0
SSS: 0
TID: 0.98

## 2018-07-31 NOTE — Progress Notes (Signed)
Subjective: 68 year old male presents the office today for evaluation of wounds to his left second and fourth toes.  He states that overall he is doing better.  Denies any drainage of pus or any swelling or redness.  He is still trying to keep them offloaded.  He denies any pain. Denies any systemic complaints such as fevers, chills, nausea, vomiting. No acute changes since last appointment, and no other complaints at this time.   Objective: AAO x3, NAD DP/PT pulses palpable bilaterally, CRT less than 3 seconds Multiple digital deformities are present.  The distal aspect the left fourth and second toe there is some small hyperkeratotic tissue appears the wound is almost completely healed at this time.  There is no edema to the toes there is no drainage or pus there is no ascending cellulitis.  There is no malodor.  No other open lesions are identified. No open lesions or pre-ulcerative lesions.  No pain with calf compression, swelling, warmth, erythema  Assessment: Left foot chronic ulcerations with improvement  Plan: -All treatment options discussed with the patient including all alternatives, risks, complications.  -X-rays obtained reviewed.  There is no cortical destruction suggestive of osteomyelitis.  No soft tissue present. -I debrided the left second and fourth digit wounds which has some hyperkeratotic tissue around the area without any complications or bleeding.  I do not use a small amount of antibiotic ointment and a dressing daily.  Continue offloading. -Patient encouraged to call the office with any questions, concerns, change in symptoms.   RTC 10 days for wound check or sooner if needed. Hopefully they will be healed next appointment.

## 2018-08-04 ENCOUNTER — Other Ambulatory Visit: Payer: Self-pay

## 2018-08-04 ENCOUNTER — Ambulatory Visit (INDEPENDENT_AMBULATORY_CARE_PROVIDER_SITE_OTHER): Payer: Medicare Other | Admitting: Podiatry

## 2018-08-04 DIAGNOSIS — L84 Corns and callosities: Secondary | ICD-10-CM | POA: Diagnosis not present

## 2018-08-04 DIAGNOSIS — Z7901 Long term (current) use of anticoagulants: Secondary | ICD-10-CM | POA: Diagnosis not present

## 2018-08-04 DIAGNOSIS — M2041 Other hammer toe(s) (acquired), right foot: Secondary | ICD-10-CM | POA: Diagnosis not present

## 2018-08-04 DIAGNOSIS — E11621 Type 2 diabetes mellitus with foot ulcer: Secondary | ICD-10-CM

## 2018-08-04 DIAGNOSIS — M2042 Other hammer toe(s) (acquired), left foot: Secondary | ICD-10-CM | POA: Diagnosis not present

## 2018-08-04 DIAGNOSIS — L97509 Non-pressure chronic ulcer of other part of unspecified foot with unspecified severity: Secondary | ICD-10-CM | POA: Diagnosis not present

## 2018-08-04 DIAGNOSIS — L97529 Non-pressure chronic ulcer of other part of left foot with unspecified severity: Secondary | ICD-10-CM

## 2018-08-05 NOTE — Progress Notes (Signed)
Subjective: 68 year old male presents the office today for follow evaluation of wounds on his left second fourth toe.  He also has an area on his right fourth toe that he tried trimming the callus and there was some dried blood and callus that he like to have trimmed today.  He denies any redness or drainage or any swelling to the areas.  He is asked about ways to help prevent callus from forming.  No pain. Denies any systemic complaints such as fevers, chills, nausea, vomiting. No acute changes since last appointment, and no other complaints at this time.   Objective: AAO x3, NAD DP/PT pulses palpable bilaterally, CRT less than 3 seconds Sensation decreased with Semmes-Weinstein monofilament Hammertoe contractures are present.  The distal aspect the left second and fourth toes hyperkeratotic lesions.  Upon debridement appears that the wounds of healed.  Dry blood, callus on the left fourth toe medial aspect.  Upon debridement there is no underlying ulceration drainage or any signs of infection. No open lesions or pre-ulcerative lesions.  No pain with calf compression, swelling, warmth, erythema  Assessment: Healed ulcerations  Plan: -All treatment options discussed with the patient including all alternatives, risks, complications.  -I debrided the hyperkeratotic lesions today with any complications or bleeding x3.  We discussed measures to prevent reoccurrence.  For now would continue with offloading and padding.  I do think yhe ou benefit from diabetic shoes.  Paperwork completed today for precertification as he was also molded, measured by Raiford Noble. -Patient encouraged to call the office with any questions, concerns, change in symptoms.   Return in about 9 weeks (around 10/06/2018).  For routine care  Vivi Barrack DPM

## 2018-08-19 DIAGNOSIS — E119 Type 2 diabetes mellitus without complications: Secondary | ICD-10-CM | POA: Diagnosis not present

## 2018-08-19 DIAGNOSIS — L03115 Cellulitis of right lower limb: Secondary | ICD-10-CM | POA: Diagnosis not present

## 2018-08-21 ENCOUNTER — Other Ambulatory Visit: Payer: Self-pay | Admitting: Interventional Cardiology

## 2018-08-21 DIAGNOSIS — L03115 Cellulitis of right lower limb: Secondary | ICD-10-CM | POA: Diagnosis not present

## 2018-08-21 DIAGNOSIS — L309 Dermatitis, unspecified: Secondary | ICD-10-CM | POA: Diagnosis not present

## 2018-08-21 NOTE — Telephone Encounter (Signed)
68yo, 260lbs, Scr 1.4 from 05/26/18, crcl 51ml/min Last OV 07/23/18 Indication: afib

## 2018-08-26 DIAGNOSIS — M1990 Unspecified osteoarthritis, unspecified site: Secondary | ICD-10-CM | POA: Diagnosis not present

## 2018-08-26 DIAGNOSIS — R200 Anesthesia of skin: Secondary | ICD-10-CM | POA: Diagnosis not present

## 2018-08-26 DIAGNOSIS — G4733 Obstructive sleep apnea (adult) (pediatric): Secondary | ICD-10-CM | POA: Diagnosis not present

## 2018-08-26 DIAGNOSIS — Z8249 Family history of ischemic heart disease and other diseases of the circulatory system: Secondary | ICD-10-CM | POA: Diagnosis not present

## 2018-08-26 DIAGNOSIS — I10 Essential (primary) hypertension: Secondary | ICD-10-CM | POA: Diagnosis not present

## 2018-08-26 DIAGNOSIS — Z7901 Long term (current) use of anticoagulants: Secondary | ICD-10-CM | POA: Diagnosis not present

## 2018-08-26 DIAGNOSIS — R202 Paresthesia of skin: Secondary | ICD-10-CM | POA: Diagnosis not present

## 2018-08-26 DIAGNOSIS — Z974 Presence of external hearing-aid: Secondary | ICD-10-CM | POA: Diagnosis not present

## 2018-08-26 DIAGNOSIS — R0602 Shortness of breath: Secondary | ICD-10-CM | POA: Diagnosis not present

## 2018-08-26 DIAGNOSIS — Z9841 Cataract extraction status, right eye: Secondary | ICD-10-CM | POA: Diagnosis not present

## 2018-08-26 DIAGNOSIS — I87033 Postthrombotic syndrome with ulcer and inflammation of bilateral lower extremity: Secondary | ICD-10-CM | POA: Diagnosis not present

## 2018-08-26 DIAGNOSIS — E11622 Type 2 diabetes mellitus with other skin ulcer: Secondary | ICD-10-CM | POA: Diagnosis not present

## 2018-08-26 DIAGNOSIS — G629 Polyneuropathy, unspecified: Secondary | ICD-10-CM | POA: Diagnosis not present

## 2018-08-26 DIAGNOSIS — Z794 Long term (current) use of insulin: Secondary | ICD-10-CM | POA: Diagnosis not present

## 2018-08-26 DIAGNOSIS — F3289 Other specified depressive episodes: Secondary | ICD-10-CM | POA: Diagnosis not present

## 2018-08-26 DIAGNOSIS — I482 Chronic atrial fibrillation, unspecified: Secondary | ICD-10-CM | POA: Diagnosis not present

## 2018-08-26 DIAGNOSIS — J45909 Unspecified asthma, uncomplicated: Secondary | ICD-10-CM | POA: Diagnosis not present

## 2018-08-26 DIAGNOSIS — Z809 Family history of malignant neoplasm, unspecified: Secondary | ICD-10-CM | POA: Diagnosis not present

## 2018-08-26 DIAGNOSIS — Z833 Family history of diabetes mellitus: Secondary | ICD-10-CM | POA: Diagnosis not present

## 2018-08-26 DIAGNOSIS — L97211 Non-pressure chronic ulcer of right calf limited to breakdown of skin: Secondary | ICD-10-CM | POA: Diagnosis not present

## 2018-08-26 DIAGNOSIS — E1142 Type 2 diabetes mellitus with diabetic polyneuropathy: Secondary | ICD-10-CM | POA: Diagnosis not present

## 2018-08-26 DIAGNOSIS — I739 Peripheral vascular disease, unspecified: Secondary | ICD-10-CM | POA: Diagnosis not present

## 2018-08-26 DIAGNOSIS — J302 Other seasonal allergic rhinitis: Secondary | ICD-10-CM | POA: Diagnosis not present

## 2018-08-26 DIAGNOSIS — I499 Cardiac arrhythmia, unspecified: Secondary | ICD-10-CM | POA: Diagnosis not present

## 2018-08-26 DIAGNOSIS — Z9842 Cataract extraction status, left eye: Secondary | ICD-10-CM | POA: Diagnosis not present

## 2018-09-09 DIAGNOSIS — M549 Dorsalgia, unspecified: Secondary | ICD-10-CM | POA: Diagnosis not present

## 2018-09-16 DIAGNOSIS — I499 Cardiac arrhythmia, unspecified: Secondary | ICD-10-CM | POA: Diagnosis not present

## 2018-09-16 DIAGNOSIS — Z9841 Cataract extraction status, right eye: Secondary | ICD-10-CM | POA: Diagnosis not present

## 2018-09-16 DIAGNOSIS — I482 Chronic atrial fibrillation, unspecified: Secondary | ICD-10-CM | POA: Diagnosis not present

## 2018-09-16 DIAGNOSIS — I87333 Chronic venous hypertension (idiopathic) with ulcer and inflammation of bilateral lower extremity: Secondary | ICD-10-CM | POA: Diagnosis not present

## 2018-09-16 DIAGNOSIS — E1142 Type 2 diabetes mellitus with diabetic polyneuropathy: Secondary | ICD-10-CM | POA: Diagnosis not present

## 2018-09-16 DIAGNOSIS — F3289 Other specified depressive episodes: Secondary | ICD-10-CM | POA: Diagnosis not present

## 2018-09-16 DIAGNOSIS — G4733 Obstructive sleep apnea (adult) (pediatric): Secondary | ICD-10-CM | POA: Diagnosis not present

## 2018-09-16 DIAGNOSIS — L97211 Non-pressure chronic ulcer of right calf limited to breakdown of skin: Secondary | ICD-10-CM | POA: Diagnosis not present

## 2018-09-16 DIAGNOSIS — I10 Essential (primary) hypertension: Secondary | ICD-10-CM | POA: Diagnosis not present

## 2018-09-16 DIAGNOSIS — Z836 Family history of other diseases of the respiratory system: Secondary | ICD-10-CM | POA: Diagnosis not present

## 2018-09-16 DIAGNOSIS — Z9842 Cataract extraction status, left eye: Secondary | ICD-10-CM | POA: Diagnosis not present

## 2018-09-16 DIAGNOSIS — Z794 Long term (current) use of insulin: Secondary | ICD-10-CM | POA: Diagnosis not present

## 2018-09-16 DIAGNOSIS — J45909 Unspecified asthma, uncomplicated: Secondary | ICD-10-CM | POA: Diagnosis not present

## 2018-09-16 DIAGNOSIS — Z809 Family history of malignant neoplasm, unspecified: Secondary | ICD-10-CM | POA: Diagnosis not present

## 2018-09-16 DIAGNOSIS — M1990 Unspecified osteoarthritis, unspecified site: Secondary | ICD-10-CM | POA: Diagnosis not present

## 2018-09-16 DIAGNOSIS — J302 Other seasonal allergic rhinitis: Secondary | ICD-10-CM | POA: Diagnosis not present

## 2018-09-16 DIAGNOSIS — E11622 Type 2 diabetes mellitus with other skin ulcer: Secondary | ICD-10-CM | POA: Diagnosis not present

## 2018-09-16 DIAGNOSIS — Z7901 Long term (current) use of anticoagulants: Secondary | ICD-10-CM | POA: Diagnosis not present

## 2018-09-16 DIAGNOSIS — G629 Polyneuropathy, unspecified: Secondary | ICD-10-CM | POA: Diagnosis not present

## 2018-09-16 DIAGNOSIS — Z833 Family history of diabetes mellitus: Secondary | ICD-10-CM | POA: Diagnosis not present

## 2018-09-16 DIAGNOSIS — Z8249 Family history of ischemic heart disease and other diseases of the circulatory system: Secondary | ICD-10-CM | POA: Diagnosis not present

## 2018-09-18 DIAGNOSIS — I87311 Chronic venous hypertension (idiopathic) with ulcer of right lower extremity: Secondary | ICD-10-CM | POA: Diagnosis not present

## 2018-09-23 DIAGNOSIS — I482 Chronic atrial fibrillation, unspecified: Secondary | ICD-10-CM | POA: Diagnosis not present

## 2018-09-23 DIAGNOSIS — L97211 Non-pressure chronic ulcer of right calf limited to breakdown of skin: Secondary | ICD-10-CM | POA: Diagnosis not present

## 2018-09-23 DIAGNOSIS — E11622 Type 2 diabetes mellitus with other skin ulcer: Secondary | ICD-10-CM | POA: Diagnosis not present

## 2018-09-23 DIAGNOSIS — I87333 Chronic venous hypertension (idiopathic) with ulcer and inflammation of bilateral lower extremity: Secondary | ICD-10-CM | POA: Diagnosis not present

## 2018-09-23 DIAGNOSIS — E1142 Type 2 diabetes mellitus with diabetic polyneuropathy: Secondary | ICD-10-CM | POA: Diagnosis not present

## 2018-09-23 DIAGNOSIS — I10 Essential (primary) hypertension: Secondary | ICD-10-CM | POA: Diagnosis not present

## 2018-09-30 DIAGNOSIS — I482 Chronic atrial fibrillation, unspecified: Secondary | ICD-10-CM | POA: Diagnosis not present

## 2018-09-30 DIAGNOSIS — E1142 Type 2 diabetes mellitus with diabetic polyneuropathy: Secondary | ICD-10-CM | POA: Diagnosis not present

## 2018-09-30 DIAGNOSIS — E11622 Type 2 diabetes mellitus with other skin ulcer: Secondary | ICD-10-CM | POA: Diagnosis not present

## 2018-09-30 DIAGNOSIS — I10 Essential (primary) hypertension: Secondary | ICD-10-CM | POA: Diagnosis not present

## 2018-09-30 DIAGNOSIS — L97211 Non-pressure chronic ulcer of right calf limited to breakdown of skin: Secondary | ICD-10-CM | POA: Diagnosis not present

## 2018-09-30 DIAGNOSIS — I87333 Chronic venous hypertension (idiopathic) with ulcer and inflammation of bilateral lower extremity: Secondary | ICD-10-CM | POA: Diagnosis not present

## 2018-10-02 DIAGNOSIS — M419 Scoliosis, unspecified: Secondary | ICD-10-CM | POA: Diagnosis not present

## 2018-10-02 DIAGNOSIS — Z5181 Encounter for therapeutic drug level monitoring: Secondary | ICD-10-CM | POA: Diagnosis not present

## 2018-10-02 DIAGNOSIS — Z79891 Long term (current) use of opiate analgesic: Secondary | ICD-10-CM | POA: Diagnosis not present

## 2018-10-02 DIAGNOSIS — G8929 Other chronic pain: Secondary | ICD-10-CM | POA: Diagnosis not present

## 2018-10-02 DIAGNOSIS — M545 Low back pain: Secondary | ICD-10-CM | POA: Diagnosis not present

## 2018-10-02 DIAGNOSIS — M1712 Unilateral primary osteoarthritis, left knee: Secondary | ICD-10-CM | POA: Diagnosis not present

## 2018-10-02 DIAGNOSIS — M1711 Unilateral primary osteoarthritis, right knee: Secondary | ICD-10-CM | POA: Diagnosis not present

## 2018-10-02 DIAGNOSIS — M25462 Effusion, left knee: Secondary | ICD-10-CM | POA: Diagnosis not present

## 2018-10-02 DIAGNOSIS — M170 Bilateral primary osteoarthritis of knee: Secondary | ICD-10-CM | POA: Diagnosis not present

## 2018-10-02 DIAGNOSIS — M5136 Other intervertebral disc degeneration, lumbar region: Secondary | ICD-10-CM | POA: Diagnosis not present

## 2018-10-02 DIAGNOSIS — M4316 Spondylolisthesis, lumbar region: Secondary | ICD-10-CM | POA: Diagnosis not present

## 2018-10-07 ENCOUNTER — Ambulatory Visit: Payer: Medicare Other | Admitting: Podiatry

## 2018-10-07 ENCOUNTER — Ambulatory Visit: Payer: Medicare Other | Admitting: Orthotics

## 2018-10-08 DIAGNOSIS — I10 Essential (primary) hypertension: Secondary | ICD-10-CM | POA: Diagnosis not present

## 2018-10-08 DIAGNOSIS — E1142 Type 2 diabetes mellitus with diabetic polyneuropathy: Secondary | ICD-10-CM | POA: Diagnosis not present

## 2018-10-08 DIAGNOSIS — I87333 Chronic venous hypertension (idiopathic) with ulcer and inflammation of bilateral lower extremity: Secondary | ICD-10-CM | POA: Diagnosis not present

## 2018-10-08 DIAGNOSIS — I482 Chronic atrial fibrillation, unspecified: Secondary | ICD-10-CM | POA: Diagnosis not present

## 2018-10-08 DIAGNOSIS — E11622 Type 2 diabetes mellitus with other skin ulcer: Secondary | ICD-10-CM | POA: Diagnosis not present

## 2018-10-08 DIAGNOSIS — L97211 Non-pressure chronic ulcer of right calf limited to breakdown of skin: Secondary | ICD-10-CM | POA: Diagnosis not present

## 2018-10-16 DIAGNOSIS — Z8249 Family history of ischemic heart disease and other diseases of the circulatory system: Secondary | ICD-10-CM | POA: Diagnosis not present

## 2018-10-16 DIAGNOSIS — J45909 Unspecified asthma, uncomplicated: Secondary | ICD-10-CM | POA: Diagnosis not present

## 2018-10-16 DIAGNOSIS — E1142 Type 2 diabetes mellitus with diabetic polyneuropathy: Secondary | ICD-10-CM | POA: Diagnosis not present

## 2018-10-16 DIAGNOSIS — Z833 Family history of diabetes mellitus: Secondary | ICD-10-CM | POA: Diagnosis not present

## 2018-10-16 DIAGNOSIS — Z9842 Cataract extraction status, left eye: Secondary | ICD-10-CM | POA: Diagnosis not present

## 2018-10-16 DIAGNOSIS — I87331 Chronic venous hypertension (idiopathic) with ulcer and inflammation of right lower extremity: Secondary | ICD-10-CM | POA: Diagnosis not present

## 2018-10-16 DIAGNOSIS — Z9841 Cataract extraction status, right eye: Secondary | ICD-10-CM | POA: Diagnosis not present

## 2018-10-16 DIAGNOSIS — G4733 Obstructive sleep apnea (adult) (pediatric): Secondary | ICD-10-CM | POA: Diagnosis not present

## 2018-10-16 DIAGNOSIS — L97819 Non-pressure chronic ulcer of other part of right lower leg with unspecified severity: Secondary | ICD-10-CM | POA: Diagnosis not present

## 2018-10-16 DIAGNOSIS — M1990 Unspecified osteoarthritis, unspecified site: Secondary | ICD-10-CM | POA: Diagnosis not present

## 2018-10-16 DIAGNOSIS — I10 Essential (primary) hypertension: Secondary | ICD-10-CM | POA: Diagnosis not present

## 2018-10-16 DIAGNOSIS — E11622 Type 2 diabetes mellitus with other skin ulcer: Secondary | ICD-10-CM | POA: Diagnosis not present

## 2018-10-16 DIAGNOSIS — Z809 Family history of malignant neoplasm, unspecified: Secondary | ICD-10-CM | POA: Diagnosis not present

## 2018-10-16 DIAGNOSIS — F3289 Other specified depressive episodes: Secondary | ICD-10-CM | POA: Diagnosis not present

## 2018-10-16 DIAGNOSIS — Z794 Long term (current) use of insulin: Secondary | ICD-10-CM | POA: Diagnosis not present

## 2018-10-16 DIAGNOSIS — I87333 Chronic venous hypertension (idiopathic) with ulcer and inflammation of bilateral lower extremity: Secondary | ICD-10-CM | POA: Diagnosis not present

## 2018-10-16 DIAGNOSIS — Z885 Allergy status to narcotic agent status: Secondary | ICD-10-CM | POA: Diagnosis not present

## 2018-10-16 DIAGNOSIS — Z7901 Long term (current) use of anticoagulants: Secondary | ICD-10-CM | POA: Diagnosis not present

## 2018-10-16 DIAGNOSIS — I482 Chronic atrial fibrillation, unspecified: Secondary | ICD-10-CM | POA: Diagnosis not present

## 2018-10-16 DIAGNOSIS — J302 Other seasonal allergic rhinitis: Secondary | ICD-10-CM | POA: Diagnosis not present

## 2018-10-16 DIAGNOSIS — Z836 Family history of other diseases of the respiratory system: Secondary | ICD-10-CM | POA: Diagnosis not present

## 2018-10-16 DIAGNOSIS — L03115 Cellulitis of right lower limb: Secondary | ICD-10-CM | POA: Diagnosis not present

## 2018-10-16 DIAGNOSIS — Z9989 Dependence on other enabling machines and devices: Secondary | ICD-10-CM | POA: Diagnosis not present

## 2018-10-16 DIAGNOSIS — G629 Polyneuropathy, unspecified: Secondary | ICD-10-CM | POA: Diagnosis not present

## 2018-10-16 DIAGNOSIS — L97211 Non-pressure chronic ulcer of right calf limited to breakdown of skin: Secondary | ICD-10-CM | POA: Diagnosis not present

## 2018-10-16 DIAGNOSIS — L97219 Non-pressure chronic ulcer of right calf with unspecified severity: Secondary | ICD-10-CM | POA: Diagnosis not present

## 2018-10-20 DIAGNOSIS — L97211 Non-pressure chronic ulcer of right calf limited to breakdown of skin: Secondary | ICD-10-CM | POA: Diagnosis not present

## 2018-10-20 DIAGNOSIS — I482 Chronic atrial fibrillation, unspecified: Secondary | ICD-10-CM | POA: Diagnosis not present

## 2018-10-20 DIAGNOSIS — E1142 Type 2 diabetes mellitus with diabetic polyneuropathy: Secondary | ICD-10-CM | POA: Diagnosis not present

## 2018-10-20 DIAGNOSIS — I87333 Chronic venous hypertension (idiopathic) with ulcer and inflammation of bilateral lower extremity: Secondary | ICD-10-CM | POA: Diagnosis not present

## 2018-10-20 DIAGNOSIS — I10 Essential (primary) hypertension: Secondary | ICD-10-CM | POA: Diagnosis not present

## 2018-10-20 DIAGNOSIS — L98491 Non-pressure chronic ulcer of skin of other sites limited to breakdown of skin: Secondary | ICD-10-CM | POA: Diagnosis not present

## 2018-10-20 DIAGNOSIS — E11622 Type 2 diabetes mellitus with other skin ulcer: Secondary | ICD-10-CM | POA: Diagnosis not present

## 2018-10-23 DIAGNOSIS — E1142 Type 2 diabetes mellitus with diabetic polyneuropathy: Secondary | ICD-10-CM | POA: Diagnosis not present

## 2018-10-23 DIAGNOSIS — I87333 Chronic venous hypertension (idiopathic) with ulcer and inflammation of bilateral lower extremity: Secondary | ICD-10-CM | POA: Diagnosis not present

## 2018-10-23 DIAGNOSIS — L97211 Non-pressure chronic ulcer of right calf limited to breakdown of skin: Secondary | ICD-10-CM | POA: Diagnosis not present

## 2018-10-23 DIAGNOSIS — E11622 Type 2 diabetes mellitus with other skin ulcer: Secondary | ICD-10-CM | POA: Diagnosis not present

## 2018-10-23 DIAGNOSIS — L97219 Non-pressure chronic ulcer of right calf with unspecified severity: Secondary | ICD-10-CM | POA: Diagnosis not present

## 2018-10-23 DIAGNOSIS — I10 Essential (primary) hypertension: Secondary | ICD-10-CM | POA: Diagnosis not present

## 2018-10-23 DIAGNOSIS — I482 Chronic atrial fibrillation, unspecified: Secondary | ICD-10-CM | POA: Diagnosis not present

## 2018-10-23 DIAGNOSIS — I87311 Chronic venous hypertension (idiopathic) with ulcer of right lower extremity: Secondary | ICD-10-CM | POA: Diagnosis not present

## 2018-10-23 DIAGNOSIS — L03115 Cellulitis of right lower limb: Secondary | ICD-10-CM | POA: Diagnosis not present

## 2018-10-23 DIAGNOSIS — L97819 Non-pressure chronic ulcer of other part of right lower leg with unspecified severity: Secondary | ICD-10-CM | POA: Diagnosis not present

## 2018-10-29 DIAGNOSIS — I482 Chronic atrial fibrillation, unspecified: Secondary | ICD-10-CM | POA: Diagnosis not present

## 2018-10-29 DIAGNOSIS — E11622 Type 2 diabetes mellitus with other skin ulcer: Secondary | ICD-10-CM | POA: Diagnosis not present

## 2018-10-29 DIAGNOSIS — L97211 Non-pressure chronic ulcer of right calf limited to breakdown of skin: Secondary | ICD-10-CM | POA: Diagnosis not present

## 2018-10-29 DIAGNOSIS — I87333 Chronic venous hypertension (idiopathic) with ulcer and inflammation of bilateral lower extremity: Secondary | ICD-10-CM | POA: Diagnosis not present

## 2018-10-29 DIAGNOSIS — I10 Essential (primary) hypertension: Secondary | ICD-10-CM | POA: Diagnosis not present

## 2018-10-29 DIAGNOSIS — L98491 Non-pressure chronic ulcer of skin of other sites limited to breakdown of skin: Secondary | ICD-10-CM | POA: Diagnosis not present

## 2018-10-29 DIAGNOSIS — E1142 Type 2 diabetes mellitus with diabetic polyneuropathy: Secondary | ICD-10-CM | POA: Diagnosis not present

## 2018-10-30 DIAGNOSIS — G8929 Other chronic pain: Secondary | ICD-10-CM | POA: Diagnosis not present

## 2018-10-30 DIAGNOSIS — M47817 Spondylosis without myelopathy or radiculopathy, lumbosacral region: Secondary | ICD-10-CM | POA: Diagnosis not present

## 2018-11-05 DIAGNOSIS — L97819 Non-pressure chronic ulcer of other part of right lower leg with unspecified severity: Secondary | ICD-10-CM | POA: Diagnosis not present

## 2018-11-05 DIAGNOSIS — L97211 Non-pressure chronic ulcer of right calf limited to breakdown of skin: Secondary | ICD-10-CM | POA: Diagnosis not present

## 2018-11-05 DIAGNOSIS — L03115 Cellulitis of right lower limb: Secondary | ICD-10-CM | POA: Diagnosis not present

## 2018-11-05 DIAGNOSIS — I482 Chronic atrial fibrillation, unspecified: Secondary | ICD-10-CM | POA: Diagnosis not present

## 2018-11-05 DIAGNOSIS — I10 Essential (primary) hypertension: Secondary | ICD-10-CM | POA: Diagnosis not present

## 2018-11-05 DIAGNOSIS — E1142 Type 2 diabetes mellitus with diabetic polyneuropathy: Secondary | ICD-10-CM | POA: Diagnosis not present

## 2018-11-05 DIAGNOSIS — E11622 Type 2 diabetes mellitus with other skin ulcer: Secondary | ICD-10-CM | POA: Diagnosis not present

## 2018-11-05 DIAGNOSIS — I87333 Chronic venous hypertension (idiopathic) with ulcer and inflammation of bilateral lower extremity: Secondary | ICD-10-CM | POA: Diagnosis not present

## 2018-11-05 DIAGNOSIS — I87331 Chronic venous hypertension (idiopathic) with ulcer and inflammation of right lower extremity: Secondary | ICD-10-CM | POA: Diagnosis not present

## 2018-11-05 DIAGNOSIS — L97219 Non-pressure chronic ulcer of right calf with unspecified severity: Secondary | ICD-10-CM | POA: Diagnosis not present

## 2018-11-12 DIAGNOSIS — Z9989 Dependence on other enabling machines and devices: Secondary | ICD-10-CM | POA: Diagnosis not present

## 2018-11-12 DIAGNOSIS — E11622 Type 2 diabetes mellitus with other skin ulcer: Secondary | ICD-10-CM | POA: Diagnosis not present

## 2018-11-12 DIAGNOSIS — I482 Chronic atrial fibrillation, unspecified: Secondary | ICD-10-CM | POA: Diagnosis not present

## 2018-11-12 DIAGNOSIS — F3289 Other specified depressive episodes: Secondary | ICD-10-CM | POA: Diagnosis not present

## 2018-11-12 DIAGNOSIS — J302 Other seasonal allergic rhinitis: Secondary | ICD-10-CM | POA: Diagnosis not present

## 2018-11-12 DIAGNOSIS — M1990 Unspecified osteoarthritis, unspecified site: Secondary | ICD-10-CM | POA: Diagnosis not present

## 2018-11-12 DIAGNOSIS — Z9841 Cataract extraction status, right eye: Secondary | ICD-10-CM | POA: Diagnosis not present

## 2018-11-12 DIAGNOSIS — G629 Polyneuropathy, unspecified: Secondary | ICD-10-CM | POA: Diagnosis not present

## 2018-11-12 DIAGNOSIS — I87333 Chronic venous hypertension (idiopathic) with ulcer and inflammation of bilateral lower extremity: Secondary | ICD-10-CM | POA: Diagnosis not present

## 2018-11-12 DIAGNOSIS — L98491 Non-pressure chronic ulcer of skin of other sites limited to breakdown of skin: Secondary | ICD-10-CM | POA: Diagnosis not present

## 2018-11-12 DIAGNOSIS — S41102A Unspecified open wound of left upper arm, initial encounter: Secondary | ICD-10-CM | POA: Diagnosis not present

## 2018-11-12 DIAGNOSIS — Z809 Family history of malignant neoplasm, unspecified: Secondary | ICD-10-CM | POA: Diagnosis not present

## 2018-11-12 DIAGNOSIS — Z833 Family history of diabetes mellitus: Secondary | ICD-10-CM | POA: Diagnosis not present

## 2018-11-12 DIAGNOSIS — I498 Other specified cardiac arrhythmias: Secondary | ICD-10-CM | POA: Diagnosis not present

## 2018-11-12 DIAGNOSIS — L97211 Non-pressure chronic ulcer of right calf limited to breakdown of skin: Secondary | ICD-10-CM | POA: Diagnosis not present

## 2018-11-12 DIAGNOSIS — Z825 Family history of asthma and other chronic lower respiratory diseases: Secondary | ICD-10-CM | POA: Diagnosis not present

## 2018-11-12 DIAGNOSIS — Z9842 Cataract extraction status, left eye: Secondary | ICD-10-CM | POA: Diagnosis not present

## 2018-11-12 DIAGNOSIS — J45909 Unspecified asthma, uncomplicated: Secondary | ICD-10-CM | POA: Diagnosis not present

## 2018-11-12 DIAGNOSIS — E1142 Type 2 diabetes mellitus with diabetic polyneuropathy: Secondary | ICD-10-CM | POA: Diagnosis not present

## 2018-11-12 DIAGNOSIS — Z7901 Long term (current) use of anticoagulants: Secondary | ICD-10-CM | POA: Diagnosis not present

## 2018-11-12 DIAGNOSIS — I10 Essential (primary) hypertension: Secondary | ICD-10-CM | POA: Diagnosis not present

## 2018-11-12 DIAGNOSIS — Z794 Long term (current) use of insulin: Secondary | ICD-10-CM | POA: Diagnosis not present

## 2018-11-12 DIAGNOSIS — G4733 Obstructive sleep apnea (adult) (pediatric): Secondary | ICD-10-CM | POA: Diagnosis not present

## 2018-11-12 DIAGNOSIS — Z8249 Family history of ischemic heart disease and other diseases of the circulatory system: Secondary | ICD-10-CM | POA: Diagnosis not present

## 2018-11-27 DIAGNOSIS — G8929 Other chronic pain: Secondary | ICD-10-CM | POA: Diagnosis not present

## 2018-11-27 DIAGNOSIS — M170 Bilateral primary osteoarthritis of knee: Secondary | ICD-10-CM | POA: Diagnosis not present

## 2018-11-27 DIAGNOSIS — M47816 Spondylosis without myelopathy or radiculopathy, lumbar region: Secondary | ICD-10-CM | POA: Diagnosis not present

## 2018-12-17 DIAGNOSIS — E118 Type 2 diabetes mellitus with unspecified complications: Secondary | ICD-10-CM | POA: Diagnosis not present

## 2018-12-17 DIAGNOSIS — I482 Chronic atrial fibrillation, unspecified: Secondary | ICD-10-CM | POA: Diagnosis not present

## 2018-12-17 DIAGNOSIS — M1711 Unilateral primary osteoarthritis, right knee: Secondary | ICD-10-CM | POA: Diagnosis not present

## 2018-12-17 DIAGNOSIS — N3946 Mixed incontinence: Secondary | ICD-10-CM | POA: Diagnosis not present

## 2018-12-31 DIAGNOSIS — L97509 Non-pressure chronic ulcer of other part of unspecified foot with unspecified severity: Secondary | ICD-10-CM | POA: Diagnosis not present

## 2018-12-31 DIAGNOSIS — E11621 Type 2 diabetes mellitus with foot ulcer: Secondary | ICD-10-CM | POA: Diagnosis not present

## 2018-12-31 DIAGNOSIS — M21619 Bunion of unspecified foot: Secondary | ICD-10-CM | POA: Diagnosis not present

## 2018-12-31 DIAGNOSIS — M47817 Spondylosis without myelopathy or radiculopathy, lumbosacral region: Secondary | ICD-10-CM | POA: Diagnosis not present

## 2018-12-31 DIAGNOSIS — E118 Type 2 diabetes mellitus with unspecified complications: Secondary | ICD-10-CM | POA: Diagnosis not present

## 2019-01-07 DIAGNOSIS — E118 Type 2 diabetes mellitus with unspecified complications: Secondary | ICD-10-CM | POA: Diagnosis not present

## 2019-01-07 DIAGNOSIS — L97509 Non-pressure chronic ulcer of other part of unspecified foot with unspecified severity: Secondary | ICD-10-CM | POA: Diagnosis not present

## 2019-01-07 DIAGNOSIS — E11621 Type 2 diabetes mellitus with foot ulcer: Secondary | ICD-10-CM | POA: Diagnosis not present

## 2019-01-12 ENCOUNTER — Telehealth: Payer: Self-pay | Admitting: Podiatry

## 2019-01-12 NOTE — Telephone Encounter (Signed)
Pt left message Friday @ 412pm asking if we could ship the diabetic shoes/inserts to him.   Per Liliane Channel we cannot ship diabetic shoes. Medicare policy is that we have to see the patient in the office and we bill at that time.I have left a message for pt that we are not able to mail them to him and that the authorization has expired from the pcp and we would need to get paperwork resigned by Dr Stephanie Acre. I asked him to call me to discuss further if needed

## 2019-01-13 NOTE — Telephone Encounter (Signed)
Pt called back and asking if we could go ahead and bill the insurance and mail him the shoes. I explained that medicare policy is pt has to be seen in our office to make sure the shoes fit properly.  He said it would be a couple of months before he is back in the office. I explained that he if it has been over 6 months since he seen Dr Stephanie Acre he would need to have a appt there and we would have to get new paperwork signed.

## 2019-01-20 DIAGNOSIS — L97509 Non-pressure chronic ulcer of other part of unspecified foot with unspecified severity: Secondary | ICD-10-CM | POA: Diagnosis not present

## 2019-01-20 DIAGNOSIS — J3089 Other allergic rhinitis: Secondary | ICD-10-CM | POA: Diagnosis not present

## 2019-01-20 DIAGNOSIS — E118 Type 2 diabetes mellitus with unspecified complications: Secondary | ICD-10-CM | POA: Diagnosis not present

## 2019-01-20 DIAGNOSIS — E11621 Type 2 diabetes mellitus with foot ulcer: Secondary | ICD-10-CM | POA: Diagnosis not present

## 2019-01-27 DIAGNOSIS — M2042 Other hammer toe(s) (acquired), left foot: Secondary | ICD-10-CM | POA: Diagnosis not present

## 2019-01-27 DIAGNOSIS — L03032 Cellulitis of left toe: Secondary | ICD-10-CM | POA: Diagnosis not present

## 2019-01-27 DIAGNOSIS — Z23 Encounter for immunization: Secondary | ICD-10-CM | POA: Diagnosis not present

## 2019-01-27 DIAGNOSIS — E11621 Type 2 diabetes mellitus with foot ulcer: Secondary | ICD-10-CM | POA: Diagnosis not present

## 2019-01-27 DIAGNOSIS — L97521 Non-pressure chronic ulcer of other part of left foot limited to breakdown of skin: Secondary | ICD-10-CM | POA: Diagnosis not present

## 2019-01-27 DIAGNOSIS — E10621 Type 1 diabetes mellitus with foot ulcer: Secondary | ICD-10-CM | POA: Diagnosis not present

## 2019-01-27 DIAGNOSIS — E118 Type 2 diabetes mellitus with unspecified complications: Secondary | ICD-10-CM | POA: Diagnosis not present

## 2019-01-27 DIAGNOSIS — E1042 Type 1 diabetes mellitus with diabetic polyneuropathy: Secondary | ICD-10-CM | POA: Diagnosis not present

## 2019-01-27 DIAGNOSIS — M2022 Hallux rigidus, left foot: Secondary | ICD-10-CM | POA: Diagnosis not present

## 2019-01-27 DIAGNOSIS — E1069 Type 1 diabetes mellitus with other specified complication: Secondary | ICD-10-CM | POA: Diagnosis not present

## 2019-01-27 DIAGNOSIS — L97509 Non-pressure chronic ulcer of other part of unspecified foot with unspecified severity: Secondary | ICD-10-CM | POA: Diagnosis not present

## 2019-02-09 DIAGNOSIS — I4891 Unspecified atrial fibrillation: Secondary | ICD-10-CM | POA: Diagnosis not present

## 2019-02-09 DIAGNOSIS — E1121 Type 2 diabetes mellitus with diabetic nephropathy: Secondary | ICD-10-CM | POA: Diagnosis not present

## 2019-02-09 DIAGNOSIS — F324 Major depressive disorder, single episode, in partial remission: Secondary | ICD-10-CM | POA: Diagnosis not present

## 2019-02-09 DIAGNOSIS — Z79899 Other long term (current) drug therapy: Secondary | ICD-10-CM | POA: Diagnosis not present

## 2019-02-09 DIAGNOSIS — D6869 Other thrombophilia: Secondary | ICD-10-CM | POA: Diagnosis not present

## 2019-02-09 DIAGNOSIS — I10 Essential (primary) hypertension: Secondary | ICD-10-CM | POA: Diagnosis not present

## 2019-02-09 DIAGNOSIS — N183 Chronic kidney disease, stage 3 (moderate): Secondary | ICD-10-CM | POA: Diagnosis not present

## 2019-02-09 DIAGNOSIS — Z7984 Long term (current) use of oral hypoglycemic drugs: Secondary | ICD-10-CM | POA: Diagnosis not present

## 2019-02-12 DIAGNOSIS — E1121 Type 2 diabetes mellitus with diabetic nephropathy: Secondary | ICD-10-CM | POA: Diagnosis not present

## 2019-02-12 DIAGNOSIS — Z794 Long term (current) use of insulin: Secondary | ICD-10-CM | POA: Diagnosis not present

## 2019-02-12 DIAGNOSIS — Z79899 Other long term (current) drug therapy: Secondary | ICD-10-CM | POA: Diagnosis not present

## 2019-02-12 DIAGNOSIS — I10 Essential (primary) hypertension: Secondary | ICD-10-CM | POA: Diagnosis not present

## 2019-02-12 DIAGNOSIS — N183 Chronic kidney disease, stage 3 unspecified: Secondary | ICD-10-CM | POA: Diagnosis not present

## 2019-02-16 ENCOUNTER — Ambulatory Visit: Payer: Medicare Other | Admitting: Podiatry

## 2019-02-16 ENCOUNTER — Ambulatory Visit: Payer: Medicare Other | Admitting: Orthotics

## 2019-02-27 ENCOUNTER — Other Ambulatory Visit: Payer: Self-pay | Admitting: Interventional Cardiology

## 2019-02-27 NOTE — Telephone Encounter (Signed)
Xarelto 20mg  refill request received; pt is 68 years old, weight-117.9 kg, Crea-1.43 on 02/22/2019 via [facility] at [place] PCP, last seen by Estella Husk on 07/23/2018, [place], CrCl-82.64ml/min; Dose is appropriate based on dosing criteria. Will send in refill to requested pharmacy.

## 2019-03-26 ENCOUNTER — Ambulatory Visit (INDEPENDENT_AMBULATORY_CARE_PROVIDER_SITE_OTHER): Payer: Medicare Other | Admitting: Orthotics

## 2019-03-26 ENCOUNTER — Ambulatory Visit (INDEPENDENT_AMBULATORY_CARE_PROVIDER_SITE_OTHER): Payer: Medicare Other | Admitting: Podiatry

## 2019-03-26 ENCOUNTER — Encounter: Payer: Self-pay | Admitting: Podiatry

## 2019-03-26 ENCOUNTER — Other Ambulatory Visit: Payer: Self-pay

## 2019-03-26 DIAGNOSIS — M79674 Pain in right toe(s): Secondary | ICD-10-CM | POA: Diagnosis not present

## 2019-03-26 DIAGNOSIS — L97509 Non-pressure chronic ulcer of other part of unspecified foot with unspecified severity: Secondary | ICD-10-CM

## 2019-03-26 DIAGNOSIS — M2011 Hallux valgus (acquired), right foot: Secondary | ICD-10-CM | POA: Diagnosis not present

## 2019-03-26 DIAGNOSIS — L84 Corns and callosities: Secondary | ICD-10-CM

## 2019-03-26 DIAGNOSIS — E11621 Type 2 diabetes mellitus with foot ulcer: Secondary | ICD-10-CM | POA: Diagnosis not present

## 2019-03-26 DIAGNOSIS — Z7901 Long term (current) use of anticoagulants: Secondary | ICD-10-CM

## 2019-03-26 DIAGNOSIS — M2042 Other hammer toe(s) (acquired), left foot: Secondary | ICD-10-CM | POA: Diagnosis not present

## 2019-03-26 DIAGNOSIS — M2012 Hallux valgus (acquired), left foot: Secondary | ICD-10-CM

## 2019-03-26 DIAGNOSIS — B351 Tinea unguium: Secondary | ICD-10-CM

## 2019-03-26 DIAGNOSIS — L97529 Non-pressure chronic ulcer of other part of left foot with unspecified severity: Secondary | ICD-10-CM

## 2019-03-26 DIAGNOSIS — M2041 Other hammer toe(s) (acquired), right foot: Secondary | ICD-10-CM

## 2019-03-26 DIAGNOSIS — M79675 Pain in left toe(s): Secondary | ICD-10-CM | POA: Diagnosis not present

## 2019-03-26 DIAGNOSIS — E1140 Type 2 diabetes mellitus with diabetic neuropathy, unspecified: Secondary | ICD-10-CM

## 2019-03-26 NOTE — Progress Notes (Signed)

## 2019-03-27 NOTE — Progress Notes (Signed)
Subjective: 68 y.o. returns the office today for painful, elongated, thickened toenails which he cannot trim himself and for calluses to both feet. Denies any redness or drainage around the nails/calluses.  He states the wounds are well-healed.  Denies any acute changes since last appointment and no new complaints today. Denies any systemic complaints such as fevers, chills, nausea, vomiting.   PCP: Jonathon Jordan, MD  Objective: AAO 3, NAD DP/PT pulses palpable, CRT less than 3 seconds Protective sensation decreased with Simms Weinstein monofilament Nails hypertrophic, dystrophic, elongated, brittle, discolored 10. There is tenderness overlying the nails 1-5 bilaterally. There is no surrounding erythema or drainage along the nail sites. Hyperkeratotic lesion submetatarsal bilaterally mostly submetatarsal 1.  Upon debridement there is no ongoing ulceration drainage or any signs of infection.  There is a preulcerative area in the distal aspect left second toe there is no open sore identified.  There is no edema, erythema or any drainage or pus or any signs of infection bilaterally. No open lesions or pre-ulcerative lesions are identified. No other areas of tenderness bilateral lower extremities. No overlying edema, erythema, increased warmth. No pain with calf compression, swelling, warmth, erythema.  Assessment: Patient presents with symptomatic onychomycosis, preulcerative callus  Plan: -Treatment options including alternatives, risks, complications were discussed -Nails sharply debrided 10 without complication/bleeding. -Preulcerative calluses sharply debrided x3 without any complications or bleeding.  Continue offloading. -Discussed daily foot inspection. If there are any changes, to call the office immediately.  -Follow-up in 3 months or sooner if any problems are to arise. In the meantime, encouraged to call the office with any questions, concerns, changes symptoms.  Celesta Gentile, DPM

## 2019-03-30 DIAGNOSIS — Z03818 Encounter for observation for suspected exposure to other biological agents ruled out: Secondary | ICD-10-CM | POA: Diagnosis not present

## 2019-04-01 ENCOUNTER — Telehealth: Payer: Self-pay | Admitting: Podiatry

## 2019-04-01 NOTE — Telephone Encounter (Signed)
Pt left message and had some questions about the diabetic shoes he got last week.  Returned call and he is having pressure under the toes with the new shoes/inserts. I explained to pt that he needs an appt with Liliane Channel to see if we can offload the area. Pt is going out of town and will call next week to schedule an appt.

## 2019-05-28 ENCOUNTER — Ambulatory Visit: Payer: Medicare Other | Admitting: Podiatry

## 2019-09-02 ENCOUNTER — Other Ambulatory Visit: Payer: Self-pay | Admitting: Interventional Cardiology

## 2019-09-03 NOTE — Telephone Encounter (Signed)
Called pt. Unable to leave message on machine.  

## 2019-09-03 NOTE — Telephone Encounter (Signed)
Called and spoke to pt and informed him that he is overdue for an office visit and to continue to refill medication  pt will need to see one of our providers. Pt stated that he had about 1 week left of Xarelto. Informed him that I would send in a 1 month supply. Asked pt if I could transfer him to the mainline to schedule an appointment. Pt stated he would call back to schedule an appointment.   Prescription refill sent for a 1 month supply.

## 2019-09-03 NOTE — Telephone Encounter (Signed)
Prescription refill request for Xarelto received.   Last office visit: [place], 07/23/2018 Weight: 117.9 kg Age: 69 yo Scr: 1.43, 02/12/2019 CrCl: 81.3 ml/min    Pt overdue for an office visit.

## 2019-09-07 ENCOUNTER — Telehealth: Payer: Self-pay | Admitting: Interventional Cardiology

## 2019-09-07 NOTE — Telephone Encounter (Signed)
**Note De-Identified Lawrence Knox Obfuscation** I called the pt back but could not help as all of his questions are for [employer]. I apologized and explained that I do not know the financial protocol for government or [employer] questions and recommended that he call [employer] as they are very helpful. He thanked me for calling him back and states that he will call them.

## 2019-09-07 NOTE — Telephone Encounter (Signed)
New Message    Pt is calling to speak with someone, he says they were helping him with the [employer] and [employer] forms for medication    Please advise

## 2019-09-07 NOTE — Telephone Encounter (Signed)
Called and spoke to the patient earlier. He states that he has been trying to fill out the [place] [employer] [place] forms and he has a question. He states that he did not file taxes last year because he did not make enough money. He states that he receives [employer] and wanted to know if he should include this on the forms. I told him that he can reach out to [facility] and [place] and they will be able to help walk him through everything that he needs to include on the forms. He states that he already asked them and they could not give him a clear answer and asks if someone in our office can help walk him through it. I told him that he would likely need to include all sources of income.   He states that he is going to finish filling out the forms but he is in [place] taking care of his sick brother and asks how he can get them back to [place]. Made him aware that he can sign up for MyChart and scan the forms and send them to [place] after they are complete. Link to signup for MyChart has been sent to patient's email.   Patient also states that he thought that he had more Xarelto than he did and is going to run out soon. I told him that I could send a Rx to a pharmacy close to him. He states that he cannot afford to get a prescription. Made patient aware that I could leave some samples for him. He states that he has no way to pick them up. I asked him if he had anyone that could come by and get them for him. He states that either him or someone else will come by and get them on Wednesday. Samples left downstairs for patient.

## 2019-09-07 NOTE — Telephone Encounter (Signed)
Attempted to contact the patient but there was no answer and VM was full.

## 2019-09-11 NOTE — Telephone Encounter (Signed)
Patient is on the way to pick up his samples per conversation with [place] on 09/07/19. He states that he had sent in his paperwork for [employer] but got a message on MyChart about something being wrong. He states he requested a phone call if there was a problem because he has a hard time getting online. Please advise. He is on the way to the office, he lives about an hour and a half away right now.

## 2019-09-11 NOTE — Telephone Encounter (Signed)
Called and spoke to [place]. She states that she has what she needs at this time and we will submit what he has turned in. Dr. Eldridge Dace is not back in the office until 09/21/19 and will sign his part at that time. We will leave additional samples of Xarelto to get him through. Patient verbalized understanding and thanked me for the call.

## 2019-09-14 NOTE — Telephone Encounter (Signed)
**Note De-Identified Gurkirat Basher Obfuscation** I have completed the MD page of the pts [facility] [place] pt asst application and emailed to Dr Hoyle Barr nurse to obtain his signature, date and fax to [facility] pt asst program at number written on cover letter.

## 2019-09-21 NOTE — Telephone Encounter (Signed)
Patient assistance form has been signed by Dr. Eldridge Dace and faxed to [facility]. Confirmation received that fax was successful.

## 2019-09-24 ENCOUNTER — Telehealth: Payer: Self-pay | Admitting: Interventional Cardiology

## 2019-09-24 NOTE — Telephone Encounter (Signed)
Lawrence Knox is calling stating he will be running out of Samples for Xarelto on 10/02/19 and doesn't have an appointment with Dr. Eldridge Dace until 10/06/19. Lawrence Knox is unable to have this prescription filled due to Dr. Eldridge Dace leaving before signing the [employer] paperwork for it. Lawrence Knox is requesting to be worked in to come in for an appointment before or the day of him running out of samples if at all possible so he is not having to make two trips. I advised Lawrence Knox Dr. Eldridge Dace does not have anything sooner available and all the PA's only have virtual available up until that date. I did add the appt to the wait list and advised him I would send a message to see if he can be worked in by a Engineer, civil (consulting). Please advise.

## 2019-09-24 NOTE — Telephone Encounter (Signed)
Called and spoke to patient. He states that he is about to run out of Xarelto before his appt with Dr. Eldridge Dace. The patient was given 4 bottles of Xarelto on 4/26 since we had to wait for Dr. Eldridge Dace to sign the [employer] form. Made him aware that he should not be running out. He states that he only saw 3 bottles and that he will check. I have moved his appt up to 5/20 which will be before he runs out of Xarelto. Forms were faxed to [employer] on 5/10, so hopefully we will hear something from them by then.

## 2019-10-01 ENCOUNTER — Other Ambulatory Visit: Payer: Self-pay

## 2019-10-01 ENCOUNTER — Ambulatory Visit (INDEPENDENT_AMBULATORY_CARE_PROVIDER_SITE_OTHER): Payer: Medicare Other | Admitting: Interventional Cardiology

## 2019-10-01 ENCOUNTER — Encounter: Payer: Self-pay | Admitting: Interventional Cardiology

## 2019-10-01 VITALS — BP 120/60 | HR 92 | Ht 71.0 in | Wt 258.6 lb

## 2019-10-01 DIAGNOSIS — I10 Essential (primary) hypertension: Secondary | ICD-10-CM | POA: Diagnosis not present

## 2019-10-01 DIAGNOSIS — N183 Chronic kidney disease, stage 3 unspecified: Secondary | ICD-10-CM

## 2019-10-01 DIAGNOSIS — I4892 Unspecified atrial flutter: Secondary | ICD-10-CM | POA: Diagnosis not present

## 2019-10-01 DIAGNOSIS — E1169 Type 2 diabetes mellitus with other specified complication: Secondary | ICD-10-CM | POA: Diagnosis not present

## 2019-10-01 DIAGNOSIS — E669 Obesity, unspecified: Secondary | ICD-10-CM

## 2019-10-01 DIAGNOSIS — E119 Type 2 diabetes mellitus without complications: Secondary | ICD-10-CM
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
## 2019-10-01 NOTE — Telephone Encounter (Signed)
**Note De-Identified Lawrence Knox Obfuscation** I called Lawrence Knox and Lawrence Knox and s/w [name] to get an update on the pts application. Per [name] the pts application is still being processed and that they should have a decision by the end of the day tomorrow 5/21.  The pt was advised at OV with Dr Eldridge Dace today to start calling [employer] daily until a decision is made and he was given another bottle of Xarelto samples.

## 2019-10-01 NOTE — Patient Instructions (Signed)

## 2019-10-05 ENCOUNTER — Ambulatory Visit: Payer: Medicare Other | Admitting: Interventional Cardiology

## 2019-10-05 NOTE — Telephone Encounter (Signed)
Letter received from [place] [employer] [place] pt asst stating that they approved the pt for asst with his Xarelto. Approval good until 10/01/2020 Record ID: [id number]  The letter states that they have notified the pt of this approval as well.

## 2019-11-24 ENCOUNTER — Other Ambulatory Visit: Payer: Self-pay | Admitting: Interventional Cardiology

## 2019-11-24 MED ORDER — RIVAROXABAN 20 MG PO TABS: 20.0000 mg | ORAL_TABLET | Freq: Every day | ORAL | 1 refills | Status: DC

## 2019-11-24 NOTE — Telephone Encounter (Signed)
*  STAT* If patient is at the pharmacy, call can be transferred to refill team.   1. Which medications need to be refilled? (please list name of each medication and dose if known) XARELTO 20 MG TABS tablet  2. Which pharmacy/location (including street and city if local pharmacy) is medication to be sent to? [id number] - [place], [place] - [address]  3. Do they need a 30 day or 90 day supply? 90 day supply

## 2019-11-24 NOTE — Telephone Encounter (Signed)
Pt last saw Dr Eldridge Dace 10/01/19, last labs 02/12/19 Creat 1.43, age 68, weight 117.3kg, CrCl 82.03, based on CrCl pt is on appropriate dosage of Xarelto 20mg  QD.  Will refill rx.

## 2020-01-26 ENCOUNTER — Telehealth: Payer: Self-pay | Admitting: Oncology

## 2020-01-26 ENCOUNTER — Encounter: Payer: Self-pay | Admitting: Oncology

## 2020-01-26 ENCOUNTER — Other Ambulatory Visit: Payer: Self-pay | Admitting: Oncology

## 2020-01-26 DIAGNOSIS — U071 COVID-19: Secondary | ICD-10-CM

## 2020-01-26 NOTE — Telephone Encounter (Signed)
I connected by phone with Lawrence Knox to discuss the potential use of an new treatment for mild to moderate COVID-19 viral infection in non-hospitalized patients.   This patient is a age/sex that meets the FDA criteria for Emergency Use Authorization of casirivimab\imdevimab.  Has a (+) direct SARS-CoV-2 viral test result 1. Has mild or moderate COVID-19  2. Is ? 69 years of age and weighs ? 40 kg 3. Is NOT hospitalized due to COVID-19 4. Is NOT requiring oxygen therapy or requiring an increase in baseline oxygen flow rate due to COVID-19 5. Is within 10 days of symptom onset 6. Has at least one of the high risk factor(s) for progression to severe COVID-19 and/or hospitalization as defined in EUA. ? Specific high risk criteria : Past Medical History:  Diagnosis Date  . Acute kidney injury (HCC) 02/26/2014  . Anxiety   . Asthma   . Atrial flutter (HCC)   . Childhood asthma - still with occasional flares 02/12/2014  . CKD (chronic kidney disease), stage II   . CKD (chronic kidney disease), stage III   . DEPRESSION 12/10/2006   Qualifier: Diagnosis of  By: Lovell Sheehan MD, Balinda Quails   . Diabetes mellitus type 2 in obese (HCC)   . Financial difficulties   . Hearing loss   . Hyperlipidemia   . Hypertension   . Hypoglycemia 02/12/2014  . Obesity 02/26/2014  . OSA on CPAP   . PAF (paroxysmal atrial fibrillation) (HCC)    a. Dx 02/2014 in setting of syncope.  . Syncope    a. 02/2014 felt secondary to hypoglycemia, compounded by AF RVR.  ?   Symptom onset  01/19/2020.    I have spoken and communicated the following to the patient or parent/caregiver:   1. FDA has authorized the emergency use of casirivimab\imdevimab for the treatment of mild to moderate COVID-19 in adults and pediatric patients with positive results of direct SARS-CoV-2 viral testing who are 74 years of age and older weighing at least 40 kg, and who are at high risk for progressing to severe COVID-19 and/or hospitalization.    2. The significant known and potential risks and benefits of casirivimab\imdevimab, and the extent to which such potential risks and benefits are unknown.   3. Information on available alternative treatments and the risks and benefits of those alternatives, including clinical trials.   4. Patients treated with casirivimab\imdevimab should continue to self-isolate and use infection control measures (e.g., wear mask, isolate, social distance, avoid sharing personal items, clean and disinfect "high touch" surfaces, and frequent handwashing) according to CDC guidelines.    5. The patient or parent/caregiver has the option to accept or refuse casirivimab\imdevimab .   After reviewing this information with the patient, The patient agreed to proceed with receiving casirivimab\imdevimab infusion and will be provided a copy of the Fact sheet prior to receiving the infusion.Mignon Pine, AGNP-C [phone] (Infusion Center Hotline)

## 2020-01-27 ENCOUNTER — Other Ambulatory Visit (HOSPITAL_COMMUNITY): Payer: Self-pay

## 2020-01-27 ENCOUNTER — Ambulatory Visit (HOSPITAL_COMMUNITY)
Admission: RE | Admit: 2020-01-27 | Discharge: 2020-01-27 | Disposition: A | Discharge status: Home / Self Care | Payer: Medicare Other | Source: Ambulatory Visit | Attending: Pulmonary Disease | Admitting: Pulmonary Disease

## 2020-01-27 DIAGNOSIS — U071 COVID-19: Secondary | ICD-10-CM | POA: Insufficient documentation

## 2020-01-27 DIAGNOSIS — Z23 Encounter for immunization: Secondary | ICD-10-CM | POA: Diagnosis not present

## 2020-01-27 MED ORDER — SODIUM CHLORIDE 0.9 % IV SOLN
1200.0000 mg | Freq: Once | INTRAVENOUS | Status: AC
  Administered 2020-01-27: 1200 mg via INTRAVENOUS
  Filled 2020-01-27: qty 10

## 2020-01-27 MED ORDER — METHYLPREDNISOLONE SODIUM SUCC 125 MG IJ SOLR: 125.0000 mg | Freq: Once | INTRAMUSCULAR | Status: DC | PRN

## 2020-01-27 MED ORDER — FAMOTIDINE IN NACL 20-0.9 MG/50ML-% IV SOLN: 20.0000 mg | Freq: Once | INTRAVENOUS | Status: DC | PRN

## 2020-01-27 MED ORDER — EPINEPHRINE 0.3 MG/0.3ML IJ SOAJ: 0.3000 mg | Freq: Once | INTRAMUSCULAR | Status: DC | PRN

## 2020-01-27 MED ORDER — ALBUTEROL SULFATE HFA 108 (90 BASE) MCG/ACT IN AERS: 2.0000 | INHALATION_SPRAY | Freq: Once | RESPIRATORY_TRACT | Status: DC | PRN

## 2020-01-27 MED ORDER — SODIUM CHLORIDE 0.9 % IV SOLN: INTRAVENOUS | Status: DC | PRN

## 2020-01-27 MED ORDER — DIPHENHYDRAMINE HCL 50 MG/ML IJ SOLN: 50.0000 mg | Freq: Once | INTRAMUSCULAR | Status: DC | PRN

## 2020-01-27 NOTE — Discharge Instructions (Signed)

## 2020-01-27 NOTE — Progress Notes (Signed)
  Diagnosis: COVID-19  Physician:Dr Wright  Procedure: [facility] Med: casirivimab\imdevimab infusion - Provided patient with casirivimab\imdevimab fact sheet for patients, parents and caregivers prior to infusion.  Complications: No immediate complications noted.  Discharge: Discharged home   Lawrence Knox 01/27/2020  

## 2020-06-21 ENCOUNTER — Other Ambulatory Visit: Payer: Self-pay | Admitting: Interventional Cardiology

## 2020-06-21 DIAGNOSIS — I480 Paroxysmal atrial fibrillation: Secondary | ICD-10-CM

## 2020-06-21 NOTE — Telephone Encounter (Signed)
Prescription refill request for Xarelto received.  Indication: Aflutter Last office visit: Varanasi, 10/01/2019 Weight: 117.3 kg  Age: 70 yo  Scr: 1.43, 02/12/2019 CrCl: 79 ml/min   Pt is overdue for blood work. [place] pt's pcp who stated pt has not had blood work since 2020. Called and spoke to pt and made him aware that we will need some updated labs to continue to refill Xarelto. Pt stated that he is out of town right now taking care of his brother and probably want be back for another month. Informed pt that if we could go ahead and schedule the lab appointment I would send in a 1 month supply. Pt agreed and schedule appointment for 3/8 for pt have blood work. Pt stated to go ahead and send in the refill to [facility] on [address] and he will be able to get transferred to  one in [place].

## 2020-07-19 ENCOUNTER — Other Ambulatory Visit: Payer: Medicare Other

## 2020-07-20 ENCOUNTER — Telehealth: Payer: Self-pay | Admitting: Interventional Cardiology

## 2020-07-20 NOTE — Telephone Encounter (Signed)
    Pt cancelled his labs on Tuesday. He said he will get his lab work at his doctor's office at [facility]. He said they will send/fax there result to Dr. Abe People

## 2020-07-25 ENCOUNTER — Other Ambulatory Visit: Payer: Self-pay | Admitting: *Deleted

## 2020-07-25 MED ORDER — RIVAROXABAN 20 MG PO TABS: 20.0000 mg | ORAL_TABLET | Freq: Every day | ORAL | 5 refills | Status: AC

## 2020-07-25 NOTE — Telephone Encounter (Signed)
Received a copy of pt's blood work from [facility].  Attempted to call pt to let him know that we received it. Called, no answer and not able to [facility]. Sent in refill for Xarelto. See refill encounter from 06/21/2020 and 07/25/2020 for further documentation.

## 2020-07-25 NOTE — Telephone Encounter (Signed)
  Indication: aflutter Last office visit:Varanasi, 07/20/2020 Weight: 117.3 kg  Age: 70 yo  Scr: 1.39, 07/20/2020 CrCl: 81 ml/min   Pt is on the correct dose of Xarelto per dosing criteria, prescription refill sent for Xarelto 20mg  daily.

## 2020-07-26 ENCOUNTER — Other Ambulatory Visit: Payer: Medicare Other

## 2022-03-07 ENCOUNTER — Encounter (HOSPITAL_COMMUNITY): Payer: Self-pay

## 2022-03-07 ENCOUNTER — Emergency Department (HOSPITAL_COMMUNITY)
Admission: EM | Admit: 2022-03-07 | Discharge: 2022-03-08 | Disposition: A | Discharge status: Home / Self Care | Payer: Medicare Other | Attending: Emergency Medicine | Admitting: Emergency Medicine

## 2022-03-07 ENCOUNTER — Emergency Department (HOSPITAL_COMMUNITY): Payer: Medicare Other

## 2022-03-07 ENCOUNTER — Other Ambulatory Visit: Payer: Self-pay

## 2022-03-07 DIAGNOSIS — S0990XA Unspecified injury of head, initial encounter: Secondary | ICD-10-CM | POA: Insufficient documentation

## 2022-03-07 DIAGNOSIS — Z7901 Long term (current) use of anticoagulants: Secondary | ICD-10-CM | POA: Diagnosis not present

## 2022-03-07 DIAGNOSIS — S0010XA Contusion of unspecified eyelid and periocular area, initial encounter: Secondary | ICD-10-CM | POA: Insufficient documentation

## 2022-03-07 DIAGNOSIS — I480 Paroxysmal atrial fibrillation: Secondary | ICD-10-CM | POA: Insufficient documentation

## 2022-03-07 DIAGNOSIS — S0510XA Contusion of eyeball and orbital tissues, unspecified eye, initial encounter: Secondary | ICD-10-CM

## 2022-03-07 DIAGNOSIS — N182 Chronic kidney disease, stage 2 (mild): Secondary | ICD-10-CM | POA: Insufficient documentation

## 2022-03-07 DIAGNOSIS — S022XXA Fracture of nasal bones, initial encounter for closed fracture: Secondary | ICD-10-CM | POA: Insufficient documentation

## 2022-03-07 DIAGNOSIS — W06XXXA Fall from bed, initial encounter: Secondary | ICD-10-CM | POA: Insufficient documentation

## 2022-03-07 DIAGNOSIS — E1122 Type 2 diabetes mellitus with diabetic chronic kidney disease: Secondary | ICD-10-CM | POA: Insufficient documentation

## 2022-03-07 DIAGNOSIS — Z79899 Other long term (current) drug therapy: Secondary | ICD-10-CM | POA: Diagnosis not present

## 2022-03-07 DIAGNOSIS — I129 Hypertensive chronic kidney disease with stage 1 through stage 4 chronic kidney disease, or unspecified chronic kidney disease: Secondary | ICD-10-CM | POA: Diagnosis not present

## 2022-03-07 DIAGNOSIS — S0992XA Unspecified injury of nose, initial encounter: Secondary | ICD-10-CM | POA: Diagnosis present

## 2022-03-07 DIAGNOSIS — Z23 Encounter for immunization: Secondary | ICD-10-CM | POA: Diagnosis not present

## 2022-03-07 DIAGNOSIS — Z7984 Long term (current) use of oral hypoglycemic drugs: Secondary | ICD-10-CM | POA: Diagnosis not present

## 2022-03-07 DIAGNOSIS — W19XXXA Unspecified fall, initial encounter: Secondary | ICD-10-CM

## 2022-03-07 DIAGNOSIS — Y92003 Bedroom of unspecified non-institutional (private) residence as the place of occurrence of the external cause: Secondary | ICD-10-CM | POA: Diagnosis not present

## 2022-03-07 LAB — CBC
HCT: 47.6 % (ref 39.0–52.0)
Hemoglobin: 15.1 g/dL (ref 13.0–17.0)
MCH: 30.3 pg (ref 26.0–34.0)
MCHC: 31.7 g/dL (ref 30.0–36.0)
MCV: 95.6 fL (ref 80.0–100.0)
Platelets: 254 10*3/uL (ref 150–400)
RBC: 4.98 MIL/uL (ref 4.22–5.81)
RDW: 13.2 % (ref 11.5–15.5)
WBC: 12.7 10*3/uL — ABNORMAL HIGH (ref 4.0–10.5)
nRBC: 0 % (ref 0.0–0.2)

## 2022-03-07 LAB — CBG MONITORING, ED: Glucose-Capillary: 142 mg/dL — ABNORMAL HIGH (ref 70–99)

## 2022-03-07 LAB — BASIC METABOLIC PANEL
Anion gap: 9 (ref 5–15)
BUN: 23 mg/dL (ref 8–23)
CO2: 25 mmol/L (ref 22–32)
Calcium: 9.4 mg/dL (ref 8.9–10.3)
Chloride: 102 mmol/L (ref 98–111)
Creatinine, Ser: 1.49 mg/dL — ABNORMAL HIGH (ref 0.61–1.24)
GFR, Estimated: 50 mL/min — ABNORMAL LOW (ref >60)
Glucose, Bld: 144 mg/dL — ABNORMAL HIGH (ref 70–99)
Potassium: 5 mmol/L (ref 3.5–5.1)
Sodium: 136 mmol/L (ref 135–145)

## 2022-03-07 NOTE — ED Provider Triage Note (Signed)
Emergency Medicine Provider Triage Evaluation Note  Lawrence Knox , a 71 y.o. male  was evaluated in triage.  Pt complains of head/neck injury after a fall.  A few hours ago, patient states he has "bad days" went to step over an empty bed frame, and the back foot clipped the frame.  States he fell forward hit his head on the bed frame and his right knee on the bed frame as well.  Denies LOC.  Used to be on Eliquis due to A-fib, previously stopped 1 week ago.  Denies dizziness, chest pain, shortness of breath, fevers before or after the fall.  Reports significant tenderness of the right upper head, by the brow bone.  Denies vision loss.  Also with neck pain.  Review of Systems  Positive:  Negative: See above  Physical Exam  BP 132/77 (BP Location: Left Arm)   Pulse 73   Temp 98 F (36.7 C) (Oral)   Resp 18   Ht 5\' 11"  (1.803 m)   Wt 117 kg   SpO2 97%   BMI 35.98 kg/m  Gen:   Awake, no distress   Resp:  Normal effort  MSK:   Moves extremities without difficulty  Other:  Tenderness and swelling of bilateral surrounding orbits, right worse than left.  With multiple lacerations to the face, bleeding controlled.  Also with tenderness and mild controlled bleeding from the left knee.  AAOx4.  Speaking coherently.  Without chest wall tenderness, abdominal tenderness.  C-spine with midline tenderness.  Medical Decision Making  Medically screening exam initiated at 6:39 PM.  Appropriate orders placed.  LUDIE PAVLIK was informed that the remainder of the evaluation will be completed by another provider, this initial triage assessment does not replace that evaluation, and the importance of remaining in the ED until their evaluation is complete.     Prince Rome, PA-C 02/58/52 1841

## 2022-03-07 NOTE — ED Triage Notes (Signed)
Pt arrives via [facility] from home with reports of fall on a empty bed frame. Pt has laceration to eyebrow and nose. Denies LOC. Pt has been off xarelto for a week.

## 2022-03-08 ENCOUNTER — Emergency Department (HOSPITAL_COMMUNITY): Payer: Medicare Other

## 2022-03-08 LAB — CBG MONITORING, ED: Glucose-Capillary: 169 mg/dL — ABNORMAL HIGH (ref 70–99)

## 2022-03-08 MED ORDER — METHOCARBAMOL 500 MG PO TABS
500.0000 mg | ORAL_TABLET | Freq: Once | ORAL | Status: AC
  Administered 2022-03-08: 500 mg via ORAL
  Filled 2022-03-08: qty 1

## 2022-03-08 MED ORDER — TETANUS-DIPHTH-ACELL PERTUSSIS 5-2.5-18.5 LF-MCG/0.5 IM SUSY
0.5000 mL | PREFILLED_SYRINGE | Freq: Once | INTRAMUSCULAR | Status: AC
  Administered 2022-03-08: 0.5 mL via INTRAMUSCULAR
  Filled 2022-03-08: qty 0.5

## 2022-03-08 MED ORDER — LIDOCAINE 5 % EX PTCH
1.0000 | MEDICATED_PATCH | Freq: Once | CUTANEOUS | Status: DC
  Administered 2022-03-08: 1 via TRANSDERMAL
  Filled 2022-03-08: qty 1

## 2022-03-08 MED ORDER — TRAMADOL HCL 50 MG PO TABS: 50.0000 mg | ORAL_TABLET | Freq: Two times a day (BID) | ORAL | 0 refills | Status: AC | PRN

## 2022-03-08 MED ORDER — TRAMADOL HCL 50 MG PO TABS
50.0000 mg | ORAL_TABLET | Freq: Once | ORAL | Status: AC
  Administered 2022-03-08: 50 mg via ORAL
  Filled 2022-03-08: qty 1

## 2022-03-08 NOTE — ED Provider Notes (Incomplete)
[facility] Provider Note   CSN: [id number] Arrival date & time: 03/07/22  1702     History {Add pertinent medical, surgical, social history, OB history to HPI:1} Chief Complaint  Patient presents with   Lawrence Knox is a 71 y.o. male.   Fall       Home Medications Prior to Admission medications   Medication Sig Start Date End Date Taking? Authorizing Provider  albuterol (PROVENTIL HFA;VENTOLIN HFA) 108 (90 Base) MCG/ACT inhaler Inhale 2 puffs into the lungs every 4 (four) hours as needed. 04/02/18   [provider]  Ascorbic Acid (VITAMIN C) 1000 MG tablet Take 1,000 mg by mouth daily.    [provider]  buPROPion (WELLBUTRIN SR) 150 MG 12 hr tablet Take 150 mg by mouth 2 (two) times daily. 02/27/18   [provider]  CINNAMON PO Take 1 tablet by mouth 2 (two) times daily.    [provider]  citalopram (CELEXA) 40 MG tablet Take 40 mg by mouth daily.    [provider]  diltiazem (CARDIZEM CD) 120 MG 24 hr capsule Take 1 capsule (120 mg total) by mouth daily. 02/12/14   Barrett, Evelene Croon, PA-C  escitalopram (LEXAPRO) 10 MG tablet Take 15 mg by mouth daily.     [provider]  fish oil-omega-3 fatty acids 1000 MG capsule Take 2 g by mouth daily.    [provider]  furosemide (LASIX) 20 MG tablet TAKE 1 TABLET BY MOUTH ONCE DAILY AS NEEDED FOR EDEMA FOR 30 DAYS 05/05/18   [provider]  gabapentin (NEURONTIN) 300 MG capsule Take 300 mg by mouth daily.    [provider]  glipiZIDE (GLUCOTROL) 5 MG tablet Take 5 mg by mouth daily.    [provider]  glucosamine-chondroitin 500-400 MG tablet Take 1 tablet by mouth 2 (two) times daily.    [provider]  insulin NPH (HUMULIN N,NOVOLIN N) 100 UNIT/ML injection Inject 35-75 Units into the skin 2 (two) times daily. 75 am and 35 pm    [provider]  lisinopril  (PRINIVIL,ZESTRIL) 20 MG tablet TAKE 1 TABLET BY MOUTH ONCE DAILY. KEEP APPOINTMENT FOR 4 21 2020 05/06/18   [provider]  Multiple Vitamin (MULITIVITAMIN WITH MINERALS) TABS Take 2 tablets by mouth daily.    [provider]  mupirocin ointment (BACTROBAN) 2 % Apply 1 application topically 2 (two) times daily. 04/15/18   Trula Slade, DPM  Nutritional Supplements (DHEA PO) Take 1 tablet by mouth daily.    [provider]  rivaroxaban (XARELTO) 20 MG TABS tablet Take 1 tablet (20 mg total) by mouth daily. 07/25/20   Jettie Booze, MD  vitamin E 400 UNIT capsule Take 400 Units by mouth daily.    [provider]      Allergies    Sulfa antibiotics and Sulfonamide derivatives    Review of Systems   Review of Systems  Physical Exam Updated Vital Signs BP (!) 160/84 (BP Location: Left Arm)   Pulse 100   Temp 99.1 F (37.3 C) (Oral)   Resp 16   Ht 5\' 11"  (1.803 m)   Wt 117 kg   SpO2 95%   BMI 35.98 kg/m  Physical Exam  ED Results / Procedures / Treatments   Labs (all labs ordered are listed, but only abnormal results are displayed) Labs Reviewed  BASIC METABOLIC PANEL - Abnormal; Notable for the following components:  Result Value   Glucose, Bld 144 (*)    Creatinine, Ser 1.49 (*)    GFR, Estimated 50 (*)    All other components within normal limits  CBC - Abnormal; Notable for the following components:   WBC 12.7 (*)    All other components within normal limits  CBG MONITORING, ED - Abnormal; Notable for the following components:   Glucose-Capillary 142 (*)    All other components within normal limits  URINALYSIS, ROUTINE W REFLEX MICROSCOPIC    EKG None  Radiology CT Head Wo Contrast  Result Date: 03/07/2022 CLINICAL DATA:  Neck trauma (Age >= 65y); Head trauma, moderate-severe EXAM: CT HEAD WITHOUT CONTRAST CT CERVICAL SPINE WITHOUT CONTRAST TECHNIQUE: Multidetector CT imaging of the head and cervical spine was  performed following the standard protocol without intravenous contrast. Multiplanar CT image reconstructions of the cervical spine were also generated. RADIATION DOSE REDUCTION: This exam was performed according to the departmental dose-optimization program which includes automated exposure control, adjustment of the mA and/or kV according to patient size and/or use of iterative reconstruction technique. COMPARISON:  CT max face 03/07/2022 FINDINGS: CT HEAD FINDINGS BRAIN: BRAIN Cerebral ventricle sizes are concordant with the degree of cerebral volume loss. No evidence of large-territorial acute infarction. No parenchymal hemorrhage. No mass lesion. No extra-axial collection. No mass effect or midline shift. No hydrocephalus. Basilar cisterns are patent. Vascular: No hyperdense vessel. Atherosclerotic calcifications are present within the cavernous internal carotid arteries. Skull: No acute fracture or focal lesion. Sinuses/Orbits: Paranasal sinuses and mastoid air cells are clear. Bilateral lens replacement. Otherwise the orbits are unremarkable. Other: None. CT CERVICAL SPINE FINDINGS Alignment: Normal. Skull base and vertebrae: Multilevel severe degenerative changes of the spine. Associated multilevel severe osseous neural foraminal stenosis. No severe osseous central canal stenosis. No acute fracture. No aggressive appearing focal osseous lesion or focal pathologic process. Soft tissues and spinal canal: No prevertebral fluid or swelling. No visible canal hematoma. Upper chest: Right upper lobe 5 mm pulmonary nodule. Limited evaluation due to respiratory motion artifact. Other: None. IMPRESSION: 1. No acute intracranial abnormality. 2. No acute displaced fracture or traumatic listhesis of the cervical spine. 3. Multilevel severe degenerative changes of the spine with associated multilevel severe osseous neural foraminal stenosis. 4. A 5 mm right upper lobe pulmonary nodule. No follow-up needed if patient is  low-risk.This recommendation follows the consensus statement: Guidelines for Management of Incidental Pulmonary Nodules Detected on CT Images: From the Fleischner Society 2017; Radiology 2017; 284:228-243. Electronically Signed   By: Iven Finn M.D.   On: 03/07/2022 20:38   CT Cervical Spine Wo Contrast  Result Date: 03/07/2022 CLINICAL DATA:  Neck trauma (Age >= 65y); Head trauma, moderate-severe EXAM: CT HEAD WITHOUT CONTRAST CT CERVICAL SPINE WITHOUT CONTRAST TECHNIQUE: Multidetector CT imaging of the head and cervical spine was performed following the standard protocol without intravenous contrast. Multiplanar CT image reconstructions of the cervical spine were also generated. RADIATION DOSE REDUCTION: This exam was performed according to the departmental dose-optimization program which includes automated exposure control, adjustment of the mA and/or kV according to patient size and/or use of iterative reconstruction technique. COMPARISON:  CT max face 03/07/2022 FINDINGS: CT HEAD FINDINGS BRAIN: BRAIN Cerebral ventricle sizes are concordant with the degree of cerebral volume loss. No evidence of large-territorial acute infarction. No parenchymal hemorrhage. No mass lesion. No extra-axial collection. No mass effect or midline shift. No hydrocephalus. Basilar cisterns are patent. Vascular: No hyperdense vessel. Atherosclerotic calcifications are present within the cavernous internal  carotid arteries. Skull: No acute fracture or focal lesion. Sinuses/Orbits: Paranasal sinuses and mastoid air cells are clear. Bilateral lens replacement. Otherwise the orbits are unremarkable. Other: None. CT CERVICAL SPINE FINDINGS Alignment: Normal. Skull base and vertebrae: Multilevel severe degenerative changes of the spine. Associated multilevel severe osseous neural foraminal stenosis. No severe osseous central canal stenosis. No acute fracture. No aggressive appearing focal osseous lesion or focal pathologic process.  Soft tissues and spinal canal: No prevertebral fluid or swelling. No visible canal hematoma. Upper chest: Right upper lobe 5 mm pulmonary nodule. Limited evaluation due to respiratory motion artifact. Other: None. IMPRESSION: 1. No acute intracranial abnormality. 2. No acute displaced fracture or traumatic listhesis of the cervical spine. 3. Multilevel severe degenerative changes of the spine with associated multilevel severe osseous neural foraminal stenosis. 4. A 5 mm right upper lobe pulmonary nodule. No follow-up needed if patient is low-risk.This recommendation follows the consensus statement: Guidelines for Management of Incidental Pulmonary Nodules Detected on CT Images: From the Fleischner Society 2017; Radiology 2017; 284:228-243. Electronically Signed   By: Iven Finn M.D.   On: 03/07/2022 20:38   CT Maxillofacial WO CM  Result Date: 03/07/2022 CLINICAL DATA:  Fell, supraorbital and nasal lacerations EXAM: CT MAXILLOFACIAL WITHOUT CONTRAST TECHNIQUE: Multidetector CT imaging of the maxillofacial structures was performed. Multiplanar CT image reconstructions were also generated. RADIATION DOSE REDUCTION: This exam was performed according to the departmental dose-optimization program which includes automated exposure control, adjustment of the mA and/or kV according to patient size and/or use of iterative reconstruction technique. COMPARISON:  None Available. FINDINGS: Osseous: There is a comminuted and moderately displaced nasal bone fracture, with overlying soft tissue swelling and laceration. No other acute displaced fractures. Prominent degenerative changes of the left temporomandibular joint. Orbits: Negative. No traumatic or inflammatory finding. Sinuses: Partial opacification of the right posterior ethmoid air cells. Remaining paranasal sinuses are clear. Soft tissues: There is bilateral periorbital and supraorbital soft tissue swelling, right greater than left. Soft tissue swelling and  laceration of the nasal bridge. Limited intracranial: No significant or unexpected finding. IMPRESSION: 1. Comminuted displaced nasal bone fracture. 2. Soft tissue swelling and laceration of the nasal bridge. 3. Bilateral periorbital and supraorbital soft tissue swelling. Electronically Signed   By: Randa Ngo M.D.   On: 03/07/2022 20:17    Procedures Procedures  {Document cardiac monitor, telemetry assessment procedure when appropriate:1}  Medications Ordered in ED Medications - No data to display  ED Course/ Medical Decision Making/ A&P                           Medical Decision Making  ***  {Document critical care time when appropriate:1} {Document review of labs and clinical decision tools ie heart score, Chads2Vasc2 etc:1}  {Document your independent review of radiology images, and any outside records:1} {Document your discussion with family members, caretakers, and with consultants:1} {Document social determinants of health affecting pt's care:1} {Document your decision making why or why not admission, treatments were needed:1} Final Clinical Impression(s) / ED Diagnoses Final diagnoses:  None    Rx / DC Orders ED Discharge Orders     None

## 2022-03-08 NOTE — ED Notes (Signed)
Pt was concerned about Cbg being low small orange juice given and CBG obtained

## 2022-03-08 NOTE — Discharge Instructions (Addendum)
Your CT scan showed a nasal bone fracture, bruising around the eyes but no orbital bone fracture.  No bleeding in the brain and no evidence of neck fracture.  You can use Tylenol 650 mg every 6 hours for pain, can use prescribed tramadol for severe breakthrough pain.  Can use over-the-counter lidocaine patches as needed to help with pain as well.  Apply ice to areas of swelling.  After swelling over the nose has gone down you will need to follow-up with the ENT specialist for nasal fracture.  Avoid nose blowing.

## 2022-04-12 NOTE — ED Provider Notes (Signed)
[facility]   CSN: [id number] Arrival date & time: 03/07/22  1702     History  Chief Complaint  Patient presents with   Lawrence BowlFall    Lawrence Knox is a 71 y.o. male.  Lawrence HissJames W Knox is a 71 y.o. male with a history of type 2 diabetes, hypertension, hyperlipidemia, paroxysmal A-fib, CKD stage II, sleep apnea, who presents to the emergency department via EMS after a fall.  Patient reports that he was walking and stepping over an empty bed frame when the back of his foot clipped to the frame causing him to fall forward striking his face on the other side of the bed frame.  Patient also reports that he thinks he hit both of his knees.  Patient denies any loss of consciousness and reports some pain and swelling over his face but denies headache.  He reports that he was on Eliquis for A-fib but this was stopped 1 week ago.  He denies any dizziness, visual changes, nausea or vomiting.  No chest pain or shortness of breath.  Aside from his face and knees no pain elsewhere.  He reports some abrasions to the face with some initial bleeding, now stopped.  Unsure of last tetanus vaccination.  The history is provided by the patient and medical records.  Fall Pertinent negatives include no chest pain, no abdominal pain, no headaches and no shortness of breath.       Home Medications Prior to Admission medications   Medication Sig Start Date End Date Taking? Authorizing Provider  traMADol (ULTRAM) 50 MG tablet Take 1 tablet (50 mg total) by mouth every 12 (twelve) hours as needed. 03/08/22  Yes Dartha LodgeFord, Corian Handley N, PA-C  albuterol (PROVENTIL HFA;VENTOLIN HFA) 108 (90 Base) MCG/ACT inhaler Inhale 2 puffs into the lungs every 4 (four) hours as needed. 04/02/18   [provider]  Ascorbic Acid (VITAMIN C) 1000 MG tablet Take 1,000 mg by mouth daily.    [provider]  buPROPion (WELLBUTRIN SR) 150 MG 12 hr tablet Take 150 mg by mouth 2 (two)  times daily. 02/27/18   [provider]  CINNAMON PO Take 1 tablet by mouth 2 (two) times daily.    [provider]  citalopram (CELEXA) 40 MG tablet Take 40 mg by mouth daily.    [provider]  diltiazem (CARDIZEM CD) 120 MG 24 hr capsule Take 1 capsule (120 mg total) by mouth daily. 02/12/14   Barrett, Joline Salthonda G, PA-C  escitalopram (LEXAPRO) 10 MG tablet Take 15 mg by mouth daily.     [provider]  fish oil-omega-3 fatty acids 1000 MG capsule Take 2 g by mouth daily.    [provider]  furosemide (LASIX) 20 MG tablet TAKE 1 TABLET BY MOUTH ONCE DAILY AS NEEDED FOR EDEMA FOR 30 DAYS 05/05/18   [provider]  gabapentin (NEURONTIN) 300 MG capsule Take 300 mg by mouth daily.    [provider]  glipiZIDE (GLUCOTROL) 5 MG tablet Take 5 mg by mouth daily.    [provider]  glucosamine-chondroitin 500-400 MG tablet Take 1 tablet by mouth 2 (two) times daily.    [provider]  insulin NPH (HUMULIN N,NOVOLIN N) 100 UNIT/ML injection Inject 35-75 Units into the skin 2 (two) times daily. 75 am and 35 pm    [provider]  lisinopril (PRINIVIL,ZESTRIL) 20 MG tablet TAKE 1 TABLET BY MOUTH ONCE DAILY. KEEP APPOINTMENT FOR 4 21  2020 05/06/18   [provider]  Multiple Vitamin (MULITIVITAMIN WITH MINERALS) TABS Take 2 tablets by mouth daily.    [provider]  mupirocin ointment (BACTROBAN) 2 % Apply 1 application topically 2 (two) times daily. 04/15/18   Vivi Barrack, DPM  Nutritional Supplements (DHEA PO) Take 1 tablet by mouth daily.    [provider]  rivaroxaban (XARELTO) 20 MG TABS tablet Take 1 tablet (20 mg total) by mouth daily. 07/25/20   Corky Crafts, MD  vitamin E 400 UNIT capsule Take 400 Units by mouth daily.    [provider]      Allergies    Sulfa antibiotics and Sulfonamide derivatives    Review of Systems   Review of Systems   Constitutional:  Negative for chills and fever.  HENT:  Positive for facial swelling and nosebleeds.   Eyes:  Negative for visual disturbance.  Respiratory:  Negative for cough and shortness of breath.   Cardiovascular:  Negative for chest pain.  Gastrointestinal:  Negative for abdominal pain.  Neurological:  Negative for dizziness, syncope, light-headedness and headaches.    Physical Exam Updated Vital Signs BP (!) 141/73 (BP Location: Right Arm)   Pulse 90   Temp 99.5 F (37.5 C) (Oral)   Resp 16   Ht 5\' 11"  (1.803 m)   Wt 117 kg   SpO2 98%   BMI 35.98 kg/m  Physical Exam Vitals and nursing note reviewed.  Constitutional:      General: He is not in acute distress.    Appearance: Normal appearance. He is well-developed. He is not diaphoretic.  HENT:     Head: Normocephalic and atraumatic.     Comments: Facial swelling and bruising but no hematoma, step-off or deformity of the scalp.    Ears:     Comments: No hemotympanum bilaterally, no CSF otorrhea    Nose:     Comments: Tenderness and swelling over the bridge of the nose, overlying abrasion but no larger laceration requiring suture repair.  No active epistaxis, some dried blood present in the nares, no septal hematoma    Mouth/Throat:     Comments: No tenderness over TMJ, no malocclusion of the jaw, no broken or missing teeth. Eyes:     General:        Right eye: No discharge.        Left eye: No discharge.     Pupils: Pupils are equal, round, and reactive to light.     Comments: Bilateral periorbital and supraorbital soft tissue swelling with some ecchymosis and abrasion, no palpable bony step-off, PERRLA, EOMI, no subconjunctival hemorrhage or hyphema.  Neck:     Comments: There is some mild midline C-spine tenderness and paraspinal tenderness, Cardiovascular:     Rate and Rhythm: Normal rate and regular rhythm.     Pulses: Normal pulses.     Heart sounds: Normal heart sounds.  Pulmonary:     Effort: Pulmonary  effort is normal. No respiratory distress.     Breath sounds: Normal breath sounds. No wheezing or rales.     Comments: Respirations equal and unlabored, patient able to speak in full sentences, lungs clear to auscultation bilaterally  Chest:     Chest wall: No tenderness.  Abdominal:     General: Bowel sounds are normal. There is no distension.     Palpations: Abdomen is soft. There is no mass.     Tenderness: There is no abdominal tenderness. There is no guarding.  Comments: Abdomen soft, nondistended, nontender to palpation in all quadrants without guarding or peritoneal signs  Musculoskeletal:        General: No deformity.     Cervical back: Neck supple.  Skin:    General: Skin is warm and dry.     Capillary Refill: Capillary refill takes less than 2 seconds.  Neurological:     Mental Status: He is alert and oriented to person, place, and time.     Coordination: Coordination normal.     Comments: Speech is clear, able to follow commands CN III-XII intact Normal strength in upper and lower extremities bilaterally including dorsiflexion and plantar flexion, strong and equal grip strength Sensation normal to light and sharp touch Moves extremities without ataxia, coordination intact  Psychiatric:        Mood and Affect: Mood normal.        Behavior: Behavior normal.     ED Results / Procedures / Treatments   Labs (all labs ordered are listed, but only abnormal results are displayed) Labs Reviewed  BASIC METABOLIC PANEL - Abnormal; Notable for the following components:      Result Value   Glucose, Bld 144 (*)    Creatinine, Ser 1.49 (*)    GFR, Estimated 50 (*)    All other components within normal limits  CBC - Abnormal; Notable for the following components:   WBC 12.7 (*)    All other components within normal limits  CBG MONITORING, ED - Abnormal; Notable for the following components:   Glucose-Capillary 142 (*)    All other components within normal limits  CBG  MONITORING, ED - Abnormal; Notable for the following components:   Glucose-Capillary 169 (*)    All other components within normal limits    EKG EKG Interpretation  Date/Time:  Wednesday March 07 2022 18:54:17 EDT Ventricular Rate:  72 PR Interval:  168 QRS Duration: 90 QT Interval:  368 QTC Calculation: 402 R Axis:   17 Text Interpretation: Normal sinus rhythm Normal ECG When compared with ECG of 12-Feb-2014 12:12, PREVIOUS ECG IS PRESENT Confirmed by Vivien Rossetti ([id number]) on 03/08/2022 9:50:52 AM  Radiology DG Knee Complete 4 Views Left  Result Date: 03/08/2022 CLINICAL DATA:  71 year old male status post fall with pain. EXAM: LEFT KNEE - COMPLETE 4+ VIEW COMPARISON:  Previous left knee series 03/17/2018. FINDINGS: Chronic medial compartment joint space loss with tricompartmental degenerative spurring, similar to the 2019 radiographs. The medial joint space loss is moderate to severe now. A suprapatellar joint effusion at that time has regressed and is now small. Patella appears intact. No acute osseous abnormality identified. IMPRESSION: 1. Small joint effusion, decreased compared to 2019. No acute fracture or dislocation identified about the left knee. 2. Chronic tricompartmental degeneration, severe in the medial compartment. Electronically Signed   By: Odessa Fleming M.D.   On: 03/08/2022 10:58   DG Knee Complete 4 Views Right  Result Date: 03/08/2022 CLINICAL DATA:  71 year old male status post fall with pain. EXAM: RIGHT KNEE - COMPLETE 4+ VIEW COMPARISON:  None Available. FINDINGS: Tricompartmental knee joint degeneration is moderate to severe, with bulky osteophytosis. On the cross-table lateral view the patella appears intact, but there is a small to moderate suprapatellar joint effusion. No acute osseous abnormality identified. Bone mineralization is within normal limits. IMPRESSION: 1. Joint effusion with no acute fracture or dislocation identified about the right knee. 2.  Advanced tricompartmental knee joint degeneration. Electronically Signed   By: Odessa Fleming M.D.   On:  03/08/2022 10:56   CT Head Wo Contrast  Result Date: 03/07/2022 CLINICAL DATA:  Neck trauma (Age >= 65y); Head trauma, moderate-severe EXAM: CT HEAD WITHOUT CONTRAST CT CERVICAL SPINE WITHOUT CONTRAST TECHNIQUE: Multidetector CT imaging of the head and cervical spine was performed following the standard protocol without intravenous contrast. Multiplanar CT image reconstructions of the cervical spine were also generated. RADIATION DOSE REDUCTION: This exam was performed according to the departmental dose-optimization program which includes automated exposure control, adjustment of the mA and/or kV according to patient size and/or use of iterative reconstruction technique. COMPARISON:  CT max face 03/07/2022 FINDINGS: CT HEAD FINDINGS BRAIN: BRAIN Cerebral ventricle sizes are concordant with the degree of cerebral volume loss. No evidence of large-territorial acute infarction. No parenchymal hemorrhage. No mass lesion. No extra-axial collection. No mass effect or midline shift. No hydrocephalus. Basilar cisterns are patent. Vascular: No hyperdense vessel. Atherosclerotic calcifications are present within the cavernous internal carotid arteries. Skull: No acute fracture or focal lesion. Sinuses/Orbits: Paranasal sinuses and mastoid air cells are clear. Bilateral lens replacement. Otherwise the orbits are unremarkable. Other: None. CT CERVICAL SPINE FINDINGS Alignment: Normal. Skull base and vertebrae: Multilevel severe degenerative changes of the spine. Associated multilevel severe osseous neural foraminal stenosis. No severe osseous central canal stenosis. No acute fracture. No aggressive appearing focal osseous lesion or focal pathologic process. Soft tissues and spinal canal: No prevertebral fluid or swelling. No visible canal hematoma. Upper chest: Right upper lobe 5 mm pulmonary nodule. Limited evaluation due to  respiratory motion artifact. Other: None. IMPRESSION: 1. No acute intracranial abnormality. 2. No acute displaced fracture or traumatic listhesis of the cervical spine. 3. Multilevel severe degenerative changes of the spine with associated multilevel severe osseous neural foraminal stenosis. 4. A 5 mm right upper lobe pulmonary nodule. No follow-up needed if patient is low-risk.This recommendation follows the consensus statement: Guidelines for Management of Incidental Pulmonary Nodules Detected on CT Images: From the Fleischner Society 2017; Radiology 2017; 284:228-243. Electronically Signed   By: Tish Frederickson M.D.   On: 03/07/2022 20:38   CT Cervical Spine Wo Contrast  Result Date: 03/07/2022 CLINICAL DATA:  Neck trauma (Age >= 65y); Head trauma, moderate-severe EXAM: CT HEAD WITHOUT CONTRAST CT CERVICAL SPINE WITHOUT CONTRAST TECHNIQUE: Multidetector CT imaging of the head and cervical spine was performed following the standard protocol without intravenous contrast. Multiplanar CT image reconstructions of the cervical spine were also generated. RADIATION DOSE REDUCTION: This exam was performed according to the departmental dose-optimization program which includes automated exposure control, adjustment of the mA and/or kV according to patient size and/or use of iterative reconstruction technique. COMPARISON:  CT max face 03/07/2022 FINDINGS: CT HEAD FINDINGS BRAIN: BRAIN Cerebral ventricle sizes are concordant with the degree of cerebral volume loss. No evidence of large-territorial acute infarction. No parenchymal hemorrhage. No mass lesion. No extra-axial collection. No mass effect or midline shift. No hydrocephalus. Basilar cisterns are patent. Vascular: No hyperdense vessel. Atherosclerotic calcifications are present within the cavernous internal carotid arteries. Skull: No acute fracture or focal lesion. Sinuses/Orbits: Paranasal sinuses and mastoid air cells are clear. Bilateral lens replacement.  Otherwise the orbits are unremarkable. Other: None. CT CERVICAL SPINE FINDINGS Alignment: Normal. Skull base and vertebrae: Multilevel severe degenerative changes of the spine. Associated multilevel severe osseous neural foraminal stenosis. No severe osseous central canal stenosis. No acute fracture. No aggressive appearing focal osseous lesion or focal pathologic process. Soft tissues and spinal canal: No prevertebral fluid or swelling. No visible canal hematoma. Upper chest: Right  upper lobe 5 mm pulmonary nodule. Limited evaluation due to respiratory motion artifact. Other: None. IMPRESSION: 1. No acute intracranial abnormality. 2. No acute displaced fracture or traumatic listhesis of the cervical spine. 3. Multilevel severe degenerative changes of the spine with associated multilevel severe osseous neural foraminal stenosis. 4. A 5 mm right upper lobe pulmonary nodule. No follow-up needed if patient is low-risk.This recommendation follows the consensus statement: Guidelines for Management of Incidental Pulmonary Nodules Detected on CT Images: From the Fleischner Society 2017; Radiology 2017; 284:228-243. Electronically Signed   By: Tish Frederickson M.D.   On: 03/07/2022 20:38   CT Maxillofacial WO CM  Result Date: 03/07/2022 CLINICAL DATA:  Fell, supraorbital and nasal lacerations EXAM: CT MAXILLOFACIAL WITHOUT CONTRAST TECHNIQUE: Multidetector CT imaging of the maxillofacial structures was performed. Multiplanar CT image reconstructions were also generated. RADIATION DOSE REDUCTION: This exam was performed according to the departmental dose-optimization program which includes automated exposure control, adjustment of the mA and/or kV according to patient size and/or use of iterative reconstruction technique. COMPARISON:  None Available. FINDINGS: Osseous: There is a comminuted and moderately displaced nasal bone fracture, with overlying soft tissue swelling and laceration. No other acute displaced  fractures. Prominent degenerative changes of the left temporomandibular joint. Orbits: Negative. No traumatic or inflammatory finding. Sinuses: Partial opacification of the right posterior ethmoid air cells. Remaining paranasal sinuses are clear. Soft tissues: There is bilateral periorbital and supraorbital soft tissue swelling, right greater than left. Soft tissue swelling and laceration of the nasal bridge. Limited intracranial: No significant or unexpected finding. IMPRESSION: 1. Comminuted displaced nasal bone fracture. 2. Soft tissue swelling and laceration of the nasal bridge. 3. Bilateral periorbital and supraorbital soft tissue swelling. Electronically Signed   By: Sharlet Salina M.D.   On: 03/07/2022 20:17     Procedures Procedures    Medications Ordered in ED Medications  traMADol (ULTRAM) tablet 50 mg (50 mg Oral Given 03/08/22 1006)  methocarbamol (ROBAXIN) tablet 500 mg (500 mg Oral Given 03/08/22 1006)  Tdap (BOOSTRIX) injection 0.5 mL (0.5 mLs Intramuscular Given 03/08/22 1008)    ED Course/ Medical Decision Making/ A&P                           Medical Decision Making Amount and/or Complexity of Data Reviewed Radiology: ordered.  Risk Prescription drug management.   71 year old male presents to the emergency department after mechanical fall with swelling and ecchymosis noted to the face.  Complaining of some neck.  No chest or abdominal wall tenderness.  Vitals are stable and patient is alert without focal neurologic deficits.  CTs of the head, cervical spine and maxillofacial bones ordered to assess for traumatic injuries from fall as well as x-rays of bilateral knees. I have personally viewed and independently interpreted imaging and agree with radiologist findings.  No evidence of intracranial bleeding or skull fracture, no C-spine fracture, patient does have a comminuted and displaced nasal bone fracture and soft tissue swelling in this area as well as bilateral  periorbital and supraorbital facial swelling but fortunately no orbital fracture.  X-rays of the knees without evidence of fracture or dislocation, some underlying degenerative changes noted.  Patient's facial abrasions do not require sutured wound repair but tetanus was updated as patient was unsure of last tetanus vaccination.  Pain treated here in the emergency department patient has had some increased muscle stiffness in the neck and shoulders but no neurologic symptoms.  Patient will follow-up with  ENT regarding nasal bone fracture.  Discussed all imaging and reassuring workup with patient at bedside provided return precautions and follow-up instructions.  Discharged home in good condition.        Final Clinical Impression(s) / ED Diagnoses Final diagnoses:  Fall, initial encounter  Closed fracture of nasal bone, initial encounter  Contusion of periorbital region, unspecified laterality, initial encounter    Rx / DC Orders ED Discharge Orders          Ordered    traMADol (ULTRAM) 50 MG tablet  Every 12 hours PRN        03/08/22 1234              Dartha Lodge, PA-C 04/12/22 1825    Mardene Sayer, MD 04/12/22 2355

## 2022-09-08 DIAGNOSIS — I2510 Atherosclerotic heart disease of native coronary artery without angina pectoris: Secondary | ICD-10-CM | POA: Diagnosis not present

## 2022-09-08 DIAGNOSIS — F419 Anxiety disorder, unspecified: Secondary | ICD-10-CM | POA: Diagnosis not present

## 2022-09-08 DIAGNOSIS — E1165 Type 2 diabetes mellitus with hyperglycemia: Secondary | ICD-10-CM | POA: Diagnosis not present

## 2022-09-08 DIAGNOSIS — M1990 Unspecified osteoarthritis, unspecified site: Secondary | ICD-10-CM | POA: Diagnosis not present

## 2022-09-08 DIAGNOSIS — R32 Unspecified urinary incontinence: Secondary | ICD-10-CM | POA: Diagnosis not present

## 2022-09-08 DIAGNOSIS — E785 Hyperlipidemia, unspecified: Secondary | ICD-10-CM | POA: Diagnosis not present

## 2022-09-08 DIAGNOSIS — R2681 Unsteadiness on feet: Secondary | ICD-10-CM | POA: Diagnosis not present

## 2022-09-08 DIAGNOSIS — I4891 Unspecified atrial fibrillation: Secondary | ICD-10-CM | POA: Diagnosis not present

## 2022-09-08 DIAGNOSIS — Z008 Encounter for other general examination: Secondary | ICD-10-CM | POA: Diagnosis not present

## 2022-09-08 DIAGNOSIS — K219 Gastro-esophageal reflux disease without esophagitis: Secondary | ICD-10-CM | POA: Diagnosis not present

## 2022-09-08 DIAGNOSIS — E669 Obesity, unspecified: Secondary | ICD-10-CM | POA: Diagnosis not present

## 2022-09-08 DIAGNOSIS — E1151 Type 2 diabetes mellitus with diabetic peripheral angiopathy without gangrene: Secondary | ICD-10-CM | POA: Diagnosis not present

## 2022-09-08 DIAGNOSIS — D6869 Other thrombophilia: Secondary | ICD-10-CM | POA: Diagnosis not present

## 2022-09-18 DIAGNOSIS — E1122 Type 2 diabetes mellitus with diabetic chronic kidney disease: Secondary | ICD-10-CM | POA: Diagnosis not present

## 2022-09-18 DIAGNOSIS — I872 Venous insufficiency (chronic) (peripheral): Secondary | ICD-10-CM | POA: Diagnosis not present

## 2022-09-18 DIAGNOSIS — I482 Chronic atrial fibrillation, unspecified: Secondary | ICD-10-CM | POA: Diagnosis not present

## 2022-09-18 DIAGNOSIS — I509 Heart failure, unspecified: Secondary | ICD-10-CM | POA: Diagnosis not present

## 2022-09-18 DIAGNOSIS — N1831 Chronic kidney disease, stage 3a: Secondary | ICD-10-CM | POA: Diagnosis not present

## 2022-09-18 DIAGNOSIS — I739 Peripheral vascular disease, unspecified: Secondary | ICD-10-CM | POA: Diagnosis not present

## 2022-09-18 DIAGNOSIS — Z125 Encounter for screening for malignant neoplasm of prostate: Secondary | ICD-10-CM | POA: Diagnosis not present

## 2022-09-18 DIAGNOSIS — R600 Localized edema: Secondary | ICD-10-CM | POA: Diagnosis not present

## 2022-09-20 DIAGNOSIS — I872 Venous insufficiency (chronic) (peripheral): Secondary | ICD-10-CM | POA: Diagnosis not present

## 2022-09-20 DIAGNOSIS — R399 Unspecified symptoms and signs involving the genitourinary system: Secondary | ICD-10-CM | POA: Diagnosis not present

## 2022-09-20 DIAGNOSIS — Z8042 Family history of malignant neoplasm of prostate: Secondary | ICD-10-CM | POA: Diagnosis not present

## 2022-09-25 DIAGNOSIS — I872 Venous insufficiency (chronic) (peripheral): Secondary | ICD-10-CM | POA: Diagnosis not present

## 2022-09-25 DIAGNOSIS — R399 Unspecified symptoms and signs involving the genitourinary system: Secondary | ICD-10-CM | POA: Diagnosis not present

## 2022-10-02 DIAGNOSIS — E1042 Type 1 diabetes mellitus with diabetic polyneuropathy: Secondary | ICD-10-CM | POA: Diagnosis not present

## 2022-10-02 DIAGNOSIS — I872 Venous insufficiency (chronic) (peripheral): Secondary | ICD-10-CM | POA: Diagnosis not present

## 2022-10-02 DIAGNOSIS — E10621 Type 1 diabetes mellitus with foot ulcer: Secondary | ICD-10-CM | POA: Diagnosis not present

## 2022-10-02 DIAGNOSIS — B351 Tinea unguium: Secondary | ICD-10-CM | POA: Diagnosis not present

## 2022-10-02 DIAGNOSIS — M2011 Hallux valgus (acquired), right foot: Secondary | ICD-10-CM | POA: Diagnosis not present

## 2022-10-02 DIAGNOSIS — M2041 Other hammer toe(s) (acquired), right foot: Secondary | ICD-10-CM | POA: Diagnosis not present

## 2022-10-02 DIAGNOSIS — L84 Corns and callosities: Secondary | ICD-10-CM | POA: Diagnosis not present

## 2022-10-02 DIAGNOSIS — L97521 Non-pressure chronic ulcer of other part of left foot limited to breakdown of skin: Secondary | ICD-10-CM | POA: Diagnosis not present

## 2022-10-03 DIAGNOSIS — E1122 Type 2 diabetes mellitus with diabetic chronic kidney disease: Secondary | ICD-10-CM | POA: Diagnosis not present

## 2022-10-03 DIAGNOSIS — N1831 Chronic kidney disease, stage 3a: Secondary | ICD-10-CM | POA: Diagnosis not present

## 2022-10-03 DIAGNOSIS — I872 Venous insufficiency (chronic) (peripheral): Secondary | ICD-10-CM | POA: Diagnosis not present

## 2022-10-03 DIAGNOSIS — M25561 Pain in right knee: Secondary | ICD-10-CM | POA: Diagnosis not present

## 2022-10-10 DIAGNOSIS — E785 Hyperlipidemia, unspecified: Secondary | ICD-10-CM | POA: Diagnosis not present

## 2022-10-10 DIAGNOSIS — I739 Peripheral vascular disease, unspecified: Secondary | ICD-10-CM | POA: Diagnosis not present

## 2022-10-10 DIAGNOSIS — I83223 Varicose veins of left lower extremity with both ulcer of ankle and inflammation: Secondary | ICD-10-CM | POA: Diagnosis not present

## 2022-10-10 DIAGNOSIS — I871 Compression of vein: Secondary | ICD-10-CM | POA: Diagnosis not present

## 2022-10-10 DIAGNOSIS — I8311 Varicose veins of right lower extremity with inflammation: Secondary | ICD-10-CM | POA: Diagnosis not present

## 2022-10-19 DIAGNOSIS — I8311 Varicose veins of right lower extremity with inflammation: Secondary | ICD-10-CM | POA: Diagnosis not present

## 2022-10-19 DIAGNOSIS — I872 Venous insufficiency (chronic) (peripheral): Secondary | ICD-10-CM | POA: Diagnosis not present

## 2022-10-19 DIAGNOSIS — I83223 Varicose veins of left lower extremity with both ulcer of ankle and inflammation: Secondary | ICD-10-CM | POA: Diagnosis not present

## 2022-10-24 DIAGNOSIS — M791 Myalgia, unspecified site: Secondary | ICD-10-CM | POA: Diagnosis not present

## 2022-10-24 DIAGNOSIS — M6281 Muscle weakness (generalized): Secondary | ICD-10-CM | POA: Diagnosis not present

## 2022-10-24 DIAGNOSIS — M1712 Unilateral primary osteoarthritis, left knee: Secondary | ICD-10-CM | POA: Diagnosis not present

## 2022-11-05 DIAGNOSIS — G4733 Obstructive sleep apnea (adult) (pediatric): Secondary | ICD-10-CM | POA: Diagnosis not present

## 2022-11-05 DIAGNOSIS — R29898 Other symptoms and signs involving the musculoskeletal system: Secondary | ICD-10-CM | POA: Diagnosis not present

## 2022-11-05 DIAGNOSIS — E1122 Type 2 diabetes mellitus with diabetic chronic kidney disease: Secondary | ICD-10-CM | POA: Diagnosis not present

## 2022-11-05 DIAGNOSIS — Z8042 Family history of malignant neoplasm of prostate: Secondary | ICD-10-CM | POA: Diagnosis not present

## 2022-11-05 DIAGNOSIS — F321 Major depressive disorder, single episode, moderate: Secondary | ICD-10-CM | POA: Diagnosis not present

## 2022-11-08 DIAGNOSIS — M48061 Spinal stenosis, lumbar region without neurogenic claudication: Secondary | ICD-10-CM | POA: Diagnosis not present

## 2022-11-08 DIAGNOSIS — M5137 Other intervertebral disc degeneration, lumbosacral region: Secondary | ICD-10-CM | POA: Diagnosis not present

## 2022-11-08 DIAGNOSIS — M47816 Spondylosis without myelopathy or radiculopathy, lumbar region: Secondary | ICD-10-CM | POA: Diagnosis not present

## 2022-11-08 DIAGNOSIS — M6281 Muscle weakness (generalized): Secondary | ICD-10-CM | POA: Diagnosis not present

## 2022-11-08 DIAGNOSIS — E1142 Type 2 diabetes mellitus with diabetic polyneuropathy: Secondary | ICD-10-CM | POA: Diagnosis not present

## 2022-11-08 DIAGNOSIS — M5136 Other intervertebral disc degeneration, lumbar region: Secondary | ICD-10-CM | POA: Diagnosis not present

## 2022-11-12 DIAGNOSIS — I739 Peripheral vascular disease, unspecified: Secondary | ICD-10-CM | POA: Diagnosis not present

## 2022-11-27 DIAGNOSIS — N39498 Other specified urinary incontinence: Secondary | ICD-10-CM | POA: Diagnosis not present

## 2022-11-27 DIAGNOSIS — A499 Bacterial infection, unspecified: Secondary | ICD-10-CM | POA: Diagnosis not present

## 2022-11-27 DIAGNOSIS — N401 Enlarged prostate with lower urinary tract symptoms: Secondary | ICD-10-CM | POA: Diagnosis not present

## 2022-11-27 DIAGNOSIS — N390 Urinary tract infection, site not specified: Secondary | ICD-10-CM | POA: Diagnosis not present

## 2022-12-03 DIAGNOSIS — I739 Peripheral vascular disease, unspecified: Secondary | ICD-10-CM | POA: Diagnosis not present

## 2022-12-03 DIAGNOSIS — Z9181 History of falling: Secondary | ICD-10-CM | POA: Diagnosis not present

## 2022-12-03 DIAGNOSIS — N1831 Chronic kidney disease, stage 3a: Secondary | ICD-10-CM | POA: Diagnosis not present

## 2022-12-03 DIAGNOSIS — Z1211 Encounter for screening for malignant neoplasm of colon: Secondary | ICD-10-CM | POA: Diagnosis not present

## 2022-12-03 DIAGNOSIS — F321 Major depressive disorder, single episode, moderate: Secondary | ICD-10-CM | POA: Diagnosis not present

## 2022-12-03 DIAGNOSIS — E1140 Type 2 diabetes mellitus with diabetic neuropathy, unspecified: Secondary | ICD-10-CM | POA: Diagnosis not present

## 2022-12-03 DIAGNOSIS — I482 Chronic atrial fibrillation, unspecified: Secondary | ICD-10-CM | POA: Diagnosis not present

## 2022-12-03 DIAGNOSIS — Z0000 Encounter for general adult medical examination without abnormal findings: Secondary | ICD-10-CM | POA: Diagnosis not present

## 2022-12-03 DIAGNOSIS — Z1331 Encounter for screening for depression: Secondary | ICD-10-CM | POA: Diagnosis not present

## 2022-12-03 DIAGNOSIS — E1122 Type 2 diabetes mellitus with diabetic chronic kidney disease: Secondary | ICD-10-CM | POA: Diagnosis not present

## 2023-01-14 DIAGNOSIS — S43004A Unspecified dislocation of right shoulder joint, initial encounter: Secondary | ICD-10-CM | POA: Diagnosis not present

## 2023-01-15 DIAGNOSIS — S43004A Unspecified dislocation of right shoulder joint, initial encounter: Secondary | ICD-10-CM | POA: Diagnosis not present

## 2023-01-15 DIAGNOSIS — I4891 Unspecified atrial fibrillation: Secondary | ICD-10-CM | POA: Diagnosis not present

## 2023-01-15 DIAGNOSIS — R55 Syncope and collapse: Secondary | ICD-10-CM | POA: Diagnosis not present

## 2023-01-15 DIAGNOSIS — S43034A Inferior dislocation of right humerus, initial encounter: Secondary | ICD-10-CM | POA: Diagnosis not present

## 2023-01-15 DIAGNOSIS — E119 Type 2 diabetes mellitus without complications: Secondary | ICD-10-CM | POA: Diagnosis not present

## 2023-01-15 DIAGNOSIS — M47812 Spondylosis without myelopathy or radiculopathy, cervical region: Secondary | ICD-10-CM | POA: Diagnosis not present

## 2023-01-15 DIAGNOSIS — S0003XA Contusion of scalp, initial encounter: Secondary | ICD-10-CM | POA: Diagnosis not present

## 2023-01-15 DIAGNOSIS — W010XXA Fall on same level from slipping, tripping and stumbling without subsequent striking against object, initial encounter: Secondary | ICD-10-CM | POA: Diagnosis not present

## 2023-01-15 DIAGNOSIS — W1830XA Fall on same level, unspecified, initial encounter: Secondary | ICD-10-CM | POA: Diagnosis not present

## 2023-01-15 DIAGNOSIS — S3993XA Unspecified injury of pelvis, initial encounter: Secondary | ICD-10-CM | POA: Diagnosis not present

## 2023-01-15 DIAGNOSIS — Z7901 Long term (current) use of anticoagulants: Secondary | ICD-10-CM | POA: Diagnosis not present

## 2023-01-15 DIAGNOSIS — S0990XA Unspecified injury of head, initial encounter: Secondary | ICD-10-CM | POA: Diagnosis not present

## 2023-01-15 DIAGNOSIS — M5031 Other cervical disc degeneration,  high cervical region: Secondary | ICD-10-CM | POA: Diagnosis not present

## 2023-01-15 DIAGNOSIS — S199XXA Unspecified injury of neck, initial encounter: Secondary | ICD-10-CM | POA: Diagnosis not present

## 2023-01-15 DIAGNOSIS — Z794 Long term (current) use of insulin: Secondary | ICD-10-CM | POA: Diagnosis not present

## 2023-01-22 DIAGNOSIS — I739 Peripheral vascular disease, unspecified: Secondary | ICD-10-CM | POA: Diagnosis not present

## 2023-01-22 DIAGNOSIS — I83223 Varicose veins of left lower extremity with both ulcer of ankle and inflammation: Secondary | ICD-10-CM | POA: Diagnosis not present

## 2023-01-22 DIAGNOSIS — I8311 Varicose veins of right lower extremity with inflammation: Secondary | ICD-10-CM | POA: Diagnosis not present

## 2023-01-22 DIAGNOSIS — I871 Compression of vein: Secondary | ICD-10-CM | POA: Diagnosis not present

## 2023-01-29 DIAGNOSIS — M5417 Radiculopathy, lumbosacral region: Secondary | ICD-10-CM | POA: Diagnosis not present

## 2023-01-29 DIAGNOSIS — M6281 Muscle weakness (generalized): Secondary | ICD-10-CM | POA: Diagnosis not present

## 2023-01-29 DIAGNOSIS — M1712 Unilateral primary osteoarthritis, left knee: Secondary | ICD-10-CM | POA: Diagnosis not present

## 2023-02-08 DIAGNOSIS — S43014A Anterior dislocation of right humerus, initial encounter: Secondary | ICD-10-CM | POA: Diagnosis not present

## 2023-02-12 DIAGNOSIS — N401 Enlarged prostate with lower urinary tract symptoms: Secondary | ICD-10-CM | POA: Diagnosis not present

## 2023-02-12 DIAGNOSIS — N39498 Other specified urinary incontinence: Secondary | ICD-10-CM | POA: Diagnosis not present

## 2023-02-12 DIAGNOSIS — N390 Urinary tract infection, site not specified: Secondary | ICD-10-CM | POA: Diagnosis not present

## 2023-02-12 DIAGNOSIS — A499 Bacterial infection, unspecified: Secondary | ICD-10-CM | POA: Diagnosis not present

## 2023-02-26 DIAGNOSIS — M62838 Other muscle spasm: Secondary | ICD-10-CM | POA: Diagnosis not present

## 2023-02-26 DIAGNOSIS — E1140 Type 2 diabetes mellitus with diabetic neuropathy, unspecified: Secondary | ICD-10-CM | POA: Diagnosis not present

## 2023-02-27 DIAGNOSIS — I872 Venous insufficiency (chronic) (peripheral): Secondary | ICD-10-CM | POA: Diagnosis not present

## 2023-02-27 DIAGNOSIS — I83893 Varicose veins of bilateral lower extremities with other complications: Secondary | ICD-10-CM | POA: Diagnosis not present

## 2023-03-07 DIAGNOSIS — Z5189 Encounter for other specified aftercare: Secondary | ICD-10-CM | POA: Diagnosis not present

## 2023-03-07 DIAGNOSIS — X58XXXA Exposure to other specified factors, initial encounter: Secondary | ICD-10-CM | POA: Diagnosis not present

## 2023-03-07 DIAGNOSIS — S43014A Anterior dislocation of right humerus, initial encounter: Secondary | ICD-10-CM | POA: Diagnosis not present

## 2023-03-12 DIAGNOSIS — S43014A Anterior dislocation of right humerus, initial encounter: Secondary | ICD-10-CM | POA: Diagnosis not present

## 2023-03-12 DIAGNOSIS — Z5189 Encounter for other specified aftercare: Secondary | ICD-10-CM | POA: Diagnosis not present

## 2023-03-12 DIAGNOSIS — X58XXXA Exposure to other specified factors, initial encounter: Secondary | ICD-10-CM | POA: Diagnosis not present

## 2023-03-13 DIAGNOSIS — I872 Venous insufficiency (chronic) (peripheral): Secondary | ICD-10-CM | POA: Diagnosis not present

## 2023-03-13 DIAGNOSIS — I83893 Varicose veins of bilateral lower extremities with other complications: Secondary | ICD-10-CM | POA: Diagnosis not present

## 2023-03-14 DIAGNOSIS — S43014A Anterior dislocation of right humerus, initial encounter: Secondary | ICD-10-CM | POA: Diagnosis not present

## 2023-03-14 DIAGNOSIS — X58XXXA Exposure to other specified factors, initial encounter: Secondary | ICD-10-CM | POA: Diagnosis not present

## 2023-03-14 DIAGNOSIS — Z5189 Encounter for other specified aftercare: Secondary | ICD-10-CM | POA: Diagnosis not present

## 2023-03-18 DIAGNOSIS — X58XXXA Exposure to other specified factors, initial encounter: Secondary | ICD-10-CM | POA: Diagnosis not present

## 2023-03-18 DIAGNOSIS — S43014A Anterior dislocation of right humerus, initial encounter: Secondary | ICD-10-CM | POA: Diagnosis not present

## 2023-03-18 DIAGNOSIS — Y929 Unspecified place or not applicable: Secondary | ICD-10-CM | POA: Diagnosis not present

## 2023-03-18 DIAGNOSIS — M25611 Stiffness of right shoulder, not elsewhere classified: Secondary | ICD-10-CM | POA: Diagnosis not present

## 2023-03-18 DIAGNOSIS — Z5189 Encounter for other specified aftercare: Secondary | ICD-10-CM | POA: Diagnosis not present

## 2023-03-18 DIAGNOSIS — M25511 Pain in right shoulder: Secondary | ICD-10-CM | POA: Diagnosis not present

## 2023-03-19 DIAGNOSIS — I83893 Varicose veins of bilateral lower extremities with other complications: Secondary | ICD-10-CM | POA: Diagnosis not present

## 2023-03-20 DIAGNOSIS — M25511 Pain in right shoulder: Secondary | ICD-10-CM | POA: Diagnosis not present

## 2023-03-20 DIAGNOSIS — Y929 Unspecified place or not applicable: Secondary | ICD-10-CM | POA: Diagnosis not present

## 2023-03-20 DIAGNOSIS — X58XXXA Exposure to other specified factors, initial encounter: Secondary | ICD-10-CM | POA: Diagnosis not present

## 2023-03-20 DIAGNOSIS — Z5189 Encounter for other specified aftercare: Secondary | ICD-10-CM | POA: Diagnosis not present

## 2023-03-20 DIAGNOSIS — M25611 Stiffness of right shoulder, not elsewhere classified: Secondary | ICD-10-CM | POA: Diagnosis not present

## 2023-03-20 DIAGNOSIS — S43014A Anterior dislocation of right humerus, initial encounter: Secondary | ICD-10-CM | POA: Diagnosis not present

## 2023-03-25 DIAGNOSIS — S43014A Anterior dislocation of right humerus, initial encounter: Secondary | ICD-10-CM | POA: Diagnosis not present

## 2023-03-25 DIAGNOSIS — M25611 Stiffness of right shoulder, not elsewhere classified: Secondary | ICD-10-CM | POA: Diagnosis not present

## 2023-03-25 DIAGNOSIS — M25511 Pain in right shoulder: Secondary | ICD-10-CM | POA: Diagnosis not present

## 2023-03-25 DIAGNOSIS — Z5189 Encounter for other specified aftercare: Secondary | ICD-10-CM | POA: Diagnosis not present

## 2023-03-25 DIAGNOSIS — Y929 Unspecified place or not applicable: Secondary | ICD-10-CM | POA: Diagnosis not present

## 2023-03-25 DIAGNOSIS — X58XXXA Exposure to other specified factors, initial encounter: Secondary | ICD-10-CM | POA: Diagnosis not present

## 2023-04-01 DIAGNOSIS — S43014A Anterior dislocation of right humerus, initial encounter: Secondary | ICD-10-CM | POA: Diagnosis not present

## 2023-04-01 DIAGNOSIS — S43431A Superior glenoid labrum lesion of right shoulder, initial encounter: Secondary | ICD-10-CM | POA: Diagnosis not present

## 2023-04-01 DIAGNOSIS — M75121 Complete rotator cuff tear or rupture of right shoulder, not specified as traumatic: Secondary | ICD-10-CM | POA: Diagnosis not present

## 2023-04-01 DIAGNOSIS — R609 Edema, unspecified: Secondary | ICD-10-CM | POA: Diagnosis not present

## 2023-04-03 DIAGNOSIS — M109 Gout, unspecified: Secondary | ICD-10-CM | POA: Diagnosis not present

## 2023-04-03 DIAGNOSIS — Z2821 Immunization not carried out because of patient refusal: Secondary | ICD-10-CM | POA: Diagnosis not present

## 2023-04-03 DIAGNOSIS — E1122 Type 2 diabetes mellitus with diabetic chronic kidney disease: Secondary | ICD-10-CM | POA: Diagnosis not present

## 2023-04-03 DIAGNOSIS — N1831 Chronic kidney disease, stage 3a: Secondary | ICD-10-CM | POA: Diagnosis not present

## 2023-04-09 DIAGNOSIS — S46091D Other injury of muscle(s) and tendon(s) of the rotator cuff of right shoulder, subsequent encounter: Secondary | ICD-10-CM | POA: Diagnosis not present

## 2023-04-10 DIAGNOSIS — I739 Peripheral vascular disease, unspecified: Secondary | ICD-10-CM | POA: Diagnosis not present

## 2023-04-10 DIAGNOSIS — E1122 Type 2 diabetes mellitus with diabetic chronic kidney disease: Secondary | ICD-10-CM | POA: Diagnosis not present

## 2023-04-10 DIAGNOSIS — N1831 Chronic kidney disease, stage 3a: Secondary | ICD-10-CM | POA: Diagnosis not present

## 2023-04-10 DIAGNOSIS — Z01818 Encounter for other preprocedural examination: Secondary | ICD-10-CM | POA: Diagnosis not present

## 2023-04-17 DIAGNOSIS — I83893 Varicose veins of bilateral lower extremities with other complications: Secondary | ICD-10-CM | POA: Diagnosis not present

## 2023-05-01 DIAGNOSIS — I83893 Varicose veins of bilateral lower extremities with other complications: Secondary | ICD-10-CM | POA: Diagnosis not present

## 2023-05-01 DIAGNOSIS — I872 Venous insufficiency (chronic) (peripheral): Secondary | ICD-10-CM | POA: Diagnosis not present

## 2023-05-03 DIAGNOSIS — Z01810 Encounter for preprocedural cardiovascular examination: Secondary | ICD-10-CM | POA: Diagnosis not present

## 2023-05-09 DIAGNOSIS — M2041 Other hammer toe(s) (acquired), right foot: Secondary | ICD-10-CM | POA: Diagnosis not present

## 2023-05-09 DIAGNOSIS — I739 Peripheral vascular disease, unspecified: Secondary | ICD-10-CM | POA: Diagnosis not present

## 2023-05-09 DIAGNOSIS — I83893 Varicose veins of bilateral lower extremities with other complications: Secondary | ICD-10-CM | POA: Diagnosis not present

## 2023-05-09 DIAGNOSIS — I8311 Varicose veins of right lower extremity with inflammation: Secondary | ICD-10-CM | POA: Diagnosis not present

## 2023-05-09 DIAGNOSIS — M2022 Hallux rigidus, left foot: Secondary | ICD-10-CM | POA: Diagnosis not present

## 2023-05-09 DIAGNOSIS — M21612 Bunion of left foot: Secondary | ICD-10-CM | POA: Diagnosis not present

## 2023-05-09 DIAGNOSIS — I110 Hypertensive heart disease with heart failure: Secondary | ICD-10-CM | POA: Diagnosis not present

## 2023-05-09 DIAGNOSIS — M2450 Contracture, unspecified joint: Secondary | ICD-10-CM | POA: Diagnosis not present

## 2023-05-09 DIAGNOSIS — I4891 Unspecified atrial fibrillation: Secondary | ICD-10-CM | POA: Diagnosis not present

## 2023-05-09 DIAGNOSIS — I872 Venous insufficiency (chronic) (peripheral): Secondary | ICD-10-CM | POA: Diagnosis not present

## 2023-05-10 DIAGNOSIS — L97528 Non-pressure chronic ulcer of other part of left foot with other specified severity: Secondary | ICD-10-CM | POA: Diagnosis not present

## 2023-05-10 DIAGNOSIS — E1142 Type 2 diabetes mellitus with diabetic polyneuropathy: Secondary | ICD-10-CM | POA: Diagnosis not present

## 2023-05-13 DIAGNOSIS — M2450 Contracture, unspecified joint: Secondary | ICD-10-CM | POA: Diagnosis not present

## 2023-05-13 DIAGNOSIS — I872 Venous insufficiency (chronic) (peripheral): Secondary | ICD-10-CM | POA: Diagnosis not present

## 2023-05-13 DIAGNOSIS — M21621 Bunionette of right foot: Secondary | ICD-10-CM | POA: Diagnosis not present

## 2023-05-13 DIAGNOSIS — E1142 Type 2 diabetes mellitus with diabetic polyneuropathy: Secondary | ICD-10-CM | POA: Diagnosis not present

## 2023-05-13 DIAGNOSIS — L602 Onychogryphosis: Secondary | ICD-10-CM | POA: Diagnosis not present

## 2023-05-13 DIAGNOSIS — L859 Epidermal thickening, unspecified: Secondary | ICD-10-CM | POA: Diagnosis not present

## 2023-05-13 DIAGNOSIS — M21622 Bunionette of left foot: Secondary | ICD-10-CM | POA: Diagnosis not present
